# Patient Record
Sex: Male | Born: 1960
Health system: Southern US, Community
[De-identification: ages and names within clinical notes are randomized; demographics above are authoritative.]

## PROBLEM LIST (undated history)

## (undated) DIAGNOSIS — N138 Other obstructive and reflux uropathy: Secondary | ICD-10-CM

## (undated) DIAGNOSIS — E782 Mixed hyperlipidemia: Secondary | ICD-10-CM

## (undated) DIAGNOSIS — M19019 Primary osteoarthritis, unspecified shoulder: Secondary | ICD-10-CM

## (undated) DIAGNOSIS — Z972 Presence of dental prosthetic device (complete) (partial): Secondary | ICD-10-CM

## (undated) DIAGNOSIS — N529 Male erectile dysfunction, unspecified: Secondary | ICD-10-CM

## (undated) DIAGNOSIS — M722 Plantar fascial fibromatosis: Secondary | ICD-10-CM

## (undated) DIAGNOSIS — IMO0002 Reserved for concepts with insufficient information to code with codable children: Secondary | ICD-10-CM

## (undated) DIAGNOSIS — N401 Enlarged prostate with lower urinary tract symptoms: Secondary | ICD-10-CM

## (undated) HISTORY — DX: Other obstructive and reflux uropathy: N13.8

## (undated) HISTORY — DX: Plantar fascial fibromatosis: M72.2

## (undated) HISTORY — DX: Mixed hyperlipidemia: E78.2

## (undated) HISTORY — DX: Male erectile dysfunction, unspecified: N52.9

## (undated) HISTORY — DX: Primary osteoarthritis, unspecified shoulder: M19.019

## (undated) HISTORY — DX: Reserved for concepts with insufficient information to code with codable children: IMO0002

## (undated) HISTORY — DX: Benign prostatic hyperplasia with lower urinary tract symptoms: N40.1

---

## 2004-07-18 ENCOUNTER — Emergency Department: Payer: Self-pay | Admitting: Emergency Medicine

## 2006-11-09 ENCOUNTER — Ambulatory Visit: Payer: Self-pay | Admitting: Emergency Medicine

## 2006-12-31 ENCOUNTER — Ambulatory Visit: Payer: Self-pay | Admitting: Internal Medicine

## 2007-02-01 ENCOUNTER — Ambulatory Visit: Payer: Self-pay | Admitting: Emergency Medicine

## 2007-09-11 LAB — HM COLONOSCOPY

## 2008-04-23 HISTORY — PX: COLONOSCOPY: SHX174

## 2012-05-09 ENCOUNTER — Ambulatory Visit: Payer: Self-pay | Admitting: Family Medicine

## 2012-07-18 ENCOUNTER — Ambulatory Visit: Payer: Self-pay | Admitting: Internal Medicine

## 2013-09-25 LAB — LIPID PANEL
CHOLESTEROL: 246 mg/dL — AB (ref 0–200)
HDL: 61 mg/dL (ref 35–70)
LDL CALC: 146 mg/dL
Triglycerides: 195 mg/dL — AB (ref 40–160)

## 2013-09-25 LAB — TSH: TSH: 0.8 u[IU]/mL (ref <=5.90)

## 2013-09-25 LAB — CBC AND DIFFERENTIAL: Hemoglobin: 14.5 g/dL (ref 13.5–17.5)

## 2013-09-25 LAB — PSA: PSA: 1.1

## 2015-02-17 ENCOUNTER — Other Ambulatory Visit: Payer: Self-pay | Admitting: Internal Medicine

## 2015-02-17 ENCOUNTER — Encounter: Payer: Self-pay | Admitting: Internal Medicine

## 2015-02-17 DIAGNOSIS — M722 Plantar fascial fibromatosis: Secondary | ICD-10-CM | POA: Insufficient documentation

## 2015-02-17 DIAGNOSIS — IMO0002 Reserved for concepts with insufficient information to code with codable children: Secondary | ICD-10-CM

## 2015-02-17 DIAGNOSIS — M19019 Primary osteoarthritis, unspecified shoulder: Secondary | ICD-10-CM

## 2015-02-17 DIAGNOSIS — F17209 Nicotine dependence, unspecified, with unspecified nicotine-induced disorders: Secondary | ICD-10-CM | POA: Insufficient documentation

## 2015-02-17 DIAGNOSIS — Z86010 Personal history of colonic polyps: Secondary | ICD-10-CM | POA: Insufficient documentation

## 2015-02-17 DIAGNOSIS — F39 Unspecified mood [affective] disorder: Secondary | ICD-10-CM | POA: Insufficient documentation

## 2015-02-17 DIAGNOSIS — E782 Mixed hyperlipidemia: Secondary | ICD-10-CM

## 2015-02-17 DIAGNOSIS — F17200 Nicotine dependence, unspecified, uncomplicated: Secondary | ICD-10-CM | POA: Insufficient documentation

## 2015-02-17 HISTORY — DX: Mixed hyperlipidemia: E78.2

## 2015-02-17 HISTORY — DX: Primary osteoarthritis, unspecified shoulder: M19.019

## 2015-02-17 HISTORY — DX: Plantar fascial fibromatosis: M72.2

## 2015-02-17 HISTORY — DX: Reserved for concepts with insufficient information to code with codable children: IMO0002

## 2015-08-05 DIAGNOSIS — M19042 Primary osteoarthritis, left hand: Secondary | ICD-10-CM | POA: Diagnosis not present

## 2015-08-05 DIAGNOSIS — M7711 Lateral epicondylitis, right elbow: Secondary | ICD-10-CM | POA: Diagnosis not present

## 2016-07-12 ENCOUNTER — Encounter: Payer: Self-pay | Admitting: Internal Medicine

## 2016-07-12 ENCOUNTER — Ambulatory Visit (INDEPENDENT_AMBULATORY_CARE_PROVIDER_SITE_OTHER): Payer: BLUE CROSS/BLUE SHIELD | Admitting: Internal Medicine

## 2016-07-12 VITALS — BP 114/68 | HR 62 | Ht 68.0 in | Wt 160.0 lb

## 2016-07-12 DIAGNOSIS — F39 Unspecified mood [affective] disorder: Secondary | ICD-10-CM

## 2016-07-12 MED ORDER — ESCITALOPRAM OXALATE 10 MG PO TABS: 10.0000 mg | ORAL_TABLET | Freq: Every day | ORAL | 0 refills | Status: DC

## 2016-07-12 NOTE — Progress Notes (Signed)
Date:  07/12/2016   Name:  Joel Humphrey   DOB:  1961/03/04   MRN:  [id number]   Chief Complaint: Anxiety (Just lost father, and feels overwhelmed with life right now. Pt does not feel suicidal but states " I couls snap at any minute. I'd hurt somebody before I could hurt myself." Stressed is the word he uses.) Anxiety  Presents for initial visit. Onset was 1 to 4 weeks ago. The problem has been gradually worsening. Symptoms include depressed mood, excessive worry, irritability and nervous/anxious behavior. Patient reports no chest pain, shortness of breath or suicidal ideas. Symptoms occur constantly. The symptoms are aggravated by work stress and family issues. The quality of sleep is poor.   Risk factors include alcohol intake.  His father passed away in [place].  He has to arrange the memorial.  His elderly mother needs to be moved here to live with him.  He is caring for his 4 y/o grandson.  He works long hours and is financially responsible for a disabled sister as well.   At this time he can not concentrate at work.  His sleep is poor.  He awakens with an active mind and worry.  He does not think he can work effectively and likely needs some time for medication to go into effect.    Review of Systems  Constitutional: Positive for irritability. Negative for chills, fatigue, fever and unexpected weight change.  Respiratory: Negative for chest tightness and shortness of breath.   Cardiovascular: Negative for chest pain.  Psychiatric/Behavioral: Positive for dysphoric mood and sleep disturbance. Negative for suicidal ideas. The patient is nervous/anxious.     Patient Active Problem List   Diagnosis Date Noted  . AC (acromioclavicular) arthritis 02/17/2015  . History of colon polyps 02/17/2015  . Combined fat and carbohydrate induced hyperlipemia 02/17/2015  . Episodic mood disorder ([place]) 02/17/2015  . Plantar fasciitis 02/17/2015  . Tendinitis of elbow or forearm 02/17/2015  .  Compulsive tobacco user syndrome 02/17/2015    Prior to Admission medications   Not on File    No Known Allergies  Past Surgical History:  Procedure Laterality Date  . COLONOSCOPY  2010   @ [facility]    Social History  Substance Use Topics  . Smoking status: Current Every Day Smoker  . Smokeless tobacco: Never Used  . Alcohol use 12.6 oz/week    21 Standard drinks or equivalent per week     Medication list has been reviewed and updated.   Physical Exam  Constitutional: He is oriented to person, place, and time. He appears well-developed. No distress.  HENT:  Head: Normocephalic and atraumatic.  Cardiovascular: Normal rate, regular rhythm and normal heart sounds.   Pulmonary/Chest: Effort normal. No respiratory distress.  Musculoskeletal: Normal range of motion.  Neurological: He is alert and oriented to person, place, and time.  Skin: Skin is warm and dry. No rash noted.  Psychiatric: His speech is normal and behavior is normal. Judgment and thought content normal. Cognition and memory are normal. He exhibits a depressed mood.  Nursing note and vitals reviewed.   BP 114/68 (BP Location: Right Arm, Patient Position: Sitting, Cuff Size: Normal)   Pulse 62   Ht 5\' 8"  (1.727 m)   Wt 160 lb (72.6 kg)   SpO2 97%   BMI 24.33 kg/m   Assessment and Plan: 1. Mood disorder (HCC) Limit alcohol intake Consider counseling Follow up 4-6 weeks Will complete FMLA - escitalopram (LEXAPRO) 10 MG tablet;  Take 1 tablet (10 mg total) by mouth daily.  Dispense: 90 tablet; Refill: 0   Meds ordered this encounter  Medications  . escitalopram (LEXAPRO) 10 MG tablet    Sig: Take 1 tablet (10 mg total) by mouth daily.    Dispense:  90 tablet    Refill:  0    Halina Maidens, MD [facility]  07/12/2016

## 2016-07-30 ENCOUNTER — Other Ambulatory Visit: Payer: Self-pay | Admitting: Internal Medicine

## 2016-07-30 MED ORDER — CITALOPRAM HYDROBROMIDE 20 MG PO TABS: 20.0000 mg | ORAL_TABLET | Freq: Every day | ORAL | 0 refills | Status: DC

## 2016-08-23 ENCOUNTER — Encounter: Payer: Self-pay | Admitting: Internal Medicine

## 2016-08-23 ENCOUNTER — Ambulatory Visit (INDEPENDENT_AMBULATORY_CARE_PROVIDER_SITE_OTHER): Payer: BLUE CROSS/BLUE SHIELD | Admitting: Internal Medicine

## 2016-08-23 VITALS — BP 110/80 | HR 72 | Ht 68.0 in | Wt 158.2 lb

## 2016-08-23 DIAGNOSIS — F39 Unspecified mood [affective] disorder: Secondary | ICD-10-CM

## 2016-08-23 MED ORDER — BUPROPION HCL ER (XL) 300 MG PO TB24: 300.0000 mg | ORAL_TABLET | Freq: Every day | ORAL | 0 refills | Status: DC

## 2016-08-23 NOTE — Patient Instructions (Addendum)
Call [facility] in [place] for an appointment as soon as possible [phone]

## 2016-08-23 NOTE — Progress Notes (Signed)
    Date:  08/23/2016   Name:  Joel Humphrey   DOB:  [birth year]   MRN:  [id number]   Chief Complaint: mood disorder (Don't feel different. Still having anxiety, not feeling any better. Scared to ask for anything stronger, but he knows he needs "help right now". ) he is taking Celexa 20 mg without side effects.  He is not sure if it is helping.  He is still stressed by his job and taking care of multiple family.  The celexa may have helped him feel slightly calmer but he is not where he needs to be. His FMLA runs out soon and needs to be extended.  I suggested another 6 weeks and well as seeing Psych.   Review of Systems  Psychiatric/Behavioral: Positive for agitation, dysphoric mood and sleep disturbance. Negative for hallucinations, self-injury and suicidal ideas. The patient is nervous/anxious.     Patient Active Problem List   Diagnosis Date Noted  . AC (acromioclavicular) arthritis 02/17/2015  . History of colon polyps 02/17/2015  . Combined fat and carbohydrate induced hyperlipemia 02/17/2015  . Episodic mood disorder ([place]) 02/17/2015  . Plantar fasciitis 02/17/2015  . Tendinitis of elbow or forearm 02/17/2015  . Compulsive tobacco user syndrome 02/17/2015    Prior to Admission medications   Medication Sig Start Date End Date Taking? Authorizing Provider  citalopram (CELEXA) 20 MG tablet Take 1 tablet (20 mg total) by mouth daily. 07/30/16  Yes [name], MD    No Known Allergies  Past Surgical History:  Procedure Laterality Date  . COLONOSCOPY  2010   @ [facility]    Social History  Substance Use Topics  . Smoking status: Current Every Day Smoker  . Smokeless tobacco: Never Used  . Alcohol use 12.6 oz/week    21 Standard drinks or equivalent per week     Medication list has been reviewed and updated.   Physical Exam  Constitutional: He is oriented to person, place, and time. He appears well-developed. No distress.  HENT:  Head: Normocephalic and atraumatic.    Pulmonary/Chest: Effort normal. No respiratory distress.  Musculoskeletal: Normal range of motion.  Neurological: He is alert and oriented to person, place, and time.  Skin: Skin is warm and dry. No rash noted.  Psychiatric: His speech is normal and behavior is normal. Thought content normal. His mood appears anxious. His affect is not angry and not inappropriate.  Nursing note and vitals reviewed.   BP 110/80 (BP Location: Right Arm, Patient Position: Sitting, Cuff Size: Normal)   Pulse 72   Ht 5\' 8"  (1.727 m)   Wt 158 lb 3.2 oz (71.8 kg)   SpO2 99%   BMI 24.05 kg/m   Assessment and Plan: 1. Episodic mood disorder (HCC) Continue Celexa Add Welbutrin Referred to [place] ordered this encounter  Medications  . buPROPion (WELLBUTRIN XL) 300 MG 24 hr tablet    Sig: Take 1 tablet (300 mg total) by mouth daily.    Dispense:  90 tablet    Refill:  0    Halina Maidens, MD [facility]  08/23/2016

## 2016-08-27 ENCOUNTER — Telehealth: Payer: Self-pay

## 2016-08-27 NOTE — Telephone Encounter (Signed)
Pt came into office requesting different medication or generic prescription for bupropion medication. States it is over $200 at pharmacy.

## 2016-08-27 NOTE — Telephone Encounter (Signed)
Bupropion is generic.  He should go ahead a see [place].  I do not have another generic alternative to offer him.

## 2016-08-28 NOTE — Telephone Encounter (Signed)
Spoke to pt. HE forgot to call [facility], but is currently in [place] and will stop by to make an appt. - informed no other generic meds for Bupropion.

## 2016-10-01 DIAGNOSIS — F331 Major depressive disorder, recurrent, moderate: Secondary | ICD-10-CM | POA: Diagnosis not present

## 2016-10-01 DIAGNOSIS — Z79899 Other long term (current) drug therapy: Secondary | ICD-10-CM | POA: Diagnosis not present

## 2016-10-04 ENCOUNTER — Encounter: Payer: Self-pay | Admitting: Internal Medicine

## 2016-10-04 ENCOUNTER — Ambulatory Visit (INDEPENDENT_AMBULATORY_CARE_PROVIDER_SITE_OTHER): Payer: BLUE CROSS/BLUE SHIELD | Admitting: Internal Medicine

## 2016-10-04 VITALS — BP 128/78 | HR 89 | Ht 68.0 in | Wt 155.0 lb

## 2016-10-04 DIAGNOSIS — F39 Unspecified mood [affective] disorder: Secondary | ICD-10-CM | POA: Diagnosis not present

## 2016-10-04 DIAGNOSIS — M722 Plantar fascial fibromatosis: Secondary | ICD-10-CM | POA: Diagnosis not present

## 2016-10-04 NOTE — Progress Notes (Signed)
Date:  10/04/2016   Name:  Joel Humphrey   DOB:  06-14-1960   MRN:  [id number]   Chief Complaint: mood disorder (Seen [facility]- was started on medication. Going to pick up medication and will call and let us know the name. Pt is gonna wheen off Citalopram to start new medication. ) Depression         This is a chronic problem.  The problem occurs constantly.The problem is unchanged.  Associated symptoms include myalgias.  Associated symptoms include no fatigue and no suicidal ideas.  Past treatments include SSRIs - Selective serotonin reuptake inhibitors. Foot Injury   The pain is present in the left foot and right foot. The quality of the pain is described as burning and cramping. The pain is moderate. He has tried NSAIDs and ice (got orthotics from [facility] for plantar fasciitis and needs to follow up with him) for the symptoms.   He has seen [facility] and they are weaning him off of celexa and starting a new medication - he will call with the name.    Review of Systems  Constitutional: Negative for chills, fatigue and fever.  Respiratory: Negative for chest tightness and shortness of breath.   Cardiovascular: Negative for chest pain.  Musculoskeletal: Positive for arthralgias and myalgias.  Skin: Negative for pallor.  Psychiatric/Behavioral: Positive for depression, dysphoric mood and sleep disturbance. Negative for suicidal ideas.    Patient Active Problem List   Diagnosis Date Noted  . AC (acromioclavicular) arthritis 02/17/2015  . History of colon polyps 02/17/2015  . Combined fat and carbohydrate induced hyperlipemia 02/17/2015  . Episodic mood disorder ([place]) 02/17/2015  . Plantar fasciitis 02/17/2015  . Tendinitis of elbow or forearm 02/17/2015  . Compulsive tobacco user syndrome 02/17/2015    Prior to Admission medications   Medication Sig Start Date End Date Taking? Authorizing Provider  citalopram (CELEXA) 20 MG tablet Take 1 tablet (20 mg total) by mouth daily. 07/30/16   Yes [name], MD    No Known Allergies  Past Surgical History:  Procedure Laterality Date  . COLONOSCOPY  2010   @ [facility]    Social History  Substance Use Topics  . Smoking status: Current Every Day Smoker  . Smokeless tobacco: Never Used  . Alcohol use 12.6 oz/week    21 Standard drinks or equivalent per week   Depression screen [facility] 2/9 10/04/2016 08/23/2016  Decreased Interest 0 1  Down, Depressed, Hopeless 1 3  PHQ - 2 Score 1 4  Altered sleeping 0 0  Tired, decreased energy 2 3  Change in appetite 0 1  Feeling bad or failure about yourself  0 0  Trouble concentrating 0 1  Moving slowly or fidgety/restless 0 0  Suicidal thoughts 0 0  PHQ-9 Score 3 9  Difficult doing work/chores Not difficult at all Very difficult     Medication list has been reviewed and updated.   Physical Exam  Constitutional: He is oriented to person, place, and time. He appears well-developed. No distress.  HENT:  Head: Normocephalic and atraumatic.  Cardiovascular: Normal rate and regular rhythm.   Pulmonary/Chest: Effort normal and breath sounds normal. No respiratory distress.  Musculoskeletal: Normal range of motion.  Mild pain in foot to dorsiflexion No edema  Neurological: He is alert and oriented to person, place, and time.  Skin: Skin is warm and dry. No rash noted.  Psychiatric: He has a normal mood and affect. His speech is normal and behavior  is normal. Thought content normal.  Nursing note and vitals reviewed.   BP 128/78   Pulse 89   Ht 5\' 8"  (1.727 m)   Wt 155 lb (70.3 kg)   SpO2 99%   BMI 23.57 kg/m   Assessment and Plan: 1. Plantar fasciitis Follow up with [facility]  2. Episodic mood disorder ([place]) Taper celexa and begin new medication as directed by [facility]   No orders of the defined types were placed in this encounter.   Halina Maidens, MD [facility]  10/04/2016

## 2016-10-22 DIAGNOSIS — Z79899 Other long term (current) drug therapy: Secondary | ICD-10-CM | POA: Diagnosis not present

## 2016-10-22 DIAGNOSIS — F331 Major depressive disorder, recurrent, moderate: Secondary | ICD-10-CM | POA: Diagnosis not present

## 2016-11-13 ENCOUNTER — Telehealth: Payer: Self-pay

## 2016-11-13 NOTE — Telephone Encounter (Signed)
Pt called stating he seen [facility]- Dr. Starleen Blue. And spoke to this doctor about his FMLA. Dr. Starleen Blue said he is not comfortable filling out FMLA forms since he has not seen pt long enough. Pt asked if we can give a additional extension from July 1st to the end of August until he see's [facility] again. Spoke with Dr. Army Melia and she said it was fine. Pt was told on VM he needs to bring these papers in tomorrow to be filled out otherwise Dr. Army Melia will be on vacation and won't be back until Aug 6. Told pt to bring papers tomorrow or call office to let us know what he wants to do. Awaiting call back.

## 2016-11-22 DIAGNOSIS — M79672 Pain in left foot: Secondary | ICD-10-CM | POA: Diagnosis not present

## 2016-11-22 DIAGNOSIS — M79671 Pain in right foot: Secondary | ICD-10-CM | POA: Diagnosis not present

## 2016-11-22 DIAGNOSIS — D219 Benign neoplasm of connective and other soft tissue, unspecified: Secondary | ICD-10-CM | POA: Diagnosis not present

## 2016-11-22 DIAGNOSIS — M722 Plantar fascial fibromatosis: Secondary | ICD-10-CM | POA: Diagnosis not present

## 2016-11-22 DIAGNOSIS — D2121 Benign neoplasm of connective and other soft tissue of right lower limb, including hip: Secondary | ICD-10-CM | POA: Diagnosis not present

## 2016-12-07 ENCOUNTER — Encounter: Payer: Self-pay | Admitting: Internal Medicine

## 2016-12-07 ENCOUNTER — Ambulatory Visit (INDEPENDENT_AMBULATORY_CARE_PROVIDER_SITE_OTHER): Payer: BLUE CROSS/BLUE SHIELD | Admitting: Internal Medicine

## 2016-12-07 VITALS — BP 112/74 | HR 71 | Temp 98.4°F | Ht 68.0 in | Wt 157.0 lb

## 2016-12-07 DIAGNOSIS — H902 Conductive hearing loss, unspecified: Secondary | ICD-10-CM | POA: Diagnosis not present

## 2016-12-07 DIAGNOSIS — H6123 Impacted cerumen, bilateral: Secondary | ICD-10-CM | POA: Diagnosis not present

## 2016-12-07 NOTE — Progress Notes (Signed)
    Date:  12/07/2016   Name:  Joel Humphrey   DOB:  [birth year]   MRN:  [id number]   Chief Complaint: Ear Fullness (LEFT ear feels clogged up. Have not been able to hear out of for 2 weeks now. Does not use Q Tips.  ) Ear Fullness   There is pain in the left ear. This is a new problem. The current episode started 1 to 4 weeks ago. The problem occurs constantly. The problem has been unchanged. There has been no fever. Associated symptoms include hearing loss. Pertinent negatives include no ear discharge.      Review of Systems  Constitutional: Negative for chills, fatigue and fever.  HENT: Positive for hearing loss. Negative for ear discharge and ear pain.   Respiratory: Negative for chest tightness and shortness of breath.   Cardiovascular: Negative for chest pain.    Patient Active Problem List   Diagnosis Date Noted  . AC (acromioclavicular) arthritis 02/17/2015  . History of colon polyps 02/17/2015  . Combined fat and carbohydrate induced hyperlipemia 02/17/2015  . Episodic mood disorder ([place]) 02/17/2015  . Plantar fasciitis 02/17/2015  . Tendinitis of elbow or forearm 02/17/2015  . Compulsive tobacco user syndrome 02/17/2015    Prior to Admission medications   Medication Sig Start Date End Date Taking? Authorizing Provider  citalopram (CELEXA) 20 MG tablet Take 1 tablet (20 mg total) by mouth daily. 07/30/16  Yes [name], MD    No Known Allergies  Past Surgical History:  Procedure Laterality Date  . COLONOSCOPY  2010   @ [facility]    Social History  Substance Use Topics  . Smoking status: Current Every Day Smoker  . Smokeless tobacco: Never Used  . Alcohol use 12.6 oz/week    21 Standard drinks or equivalent per week     Medication list has been reviewed and updated.   Physical Exam  Constitutional: He is oriented to person, place, and time. He appears well-developed. No distress.  HENT:  Head: Normocephalic and atraumatic.  Right Ear: No drainage.    Left Ear: No drainage. Decreased hearing is noted.  Nose: Right sinus exhibits no maxillary sinus tenderness. Left sinus exhibits no maxillary sinus tenderness.  Impacted cerumen bilaterally  Cardiovascular: Normal rate, regular rhythm and normal heart sounds.   Pulmonary/Chest: Effort normal and breath sounds normal. No respiratory distress. He has no wheezes.  Musculoskeletal: Normal range of motion.  Lymphadenopathy:       Head (right side): No submandibular, no preauricular and no posterior auricular adenopathy present.       Head (left side): No submandibular, no preauricular and no posterior auricular adenopathy present.  Neurological: He is alert and oriented to person, place, and time.  Skin: Skin is warm and dry. No rash noted.  Psychiatric: He has a normal mood and affect. His behavior is normal. Thought content normal.  Nursing note and vitals reviewed.   BP 112/74   Pulse 71   Temp 98.4 F (36.9 C)   Ht 5\' 8"  (1.727 m)   Wt 157 lb (71.2 kg)   SpO2 99%   BMI 23.87 kg/m   Assessment and Plan: 1. Impacted cerumen of both ears Recommend ENT referral   No orders of the defined types were placed in this encounter.   Halina Maidens, MD [facility]  12/07/2016

## 2016-12-21 DIAGNOSIS — Z6823 Body mass index (BMI) 23.0-23.9, adult: Secondary | ICD-10-CM | POA: Diagnosis not present

## 2016-12-21 DIAGNOSIS — D219 Benign neoplasm of connective and other soft tissue, unspecified: Secondary | ICD-10-CM | POA: Diagnosis not present

## 2016-12-21 DIAGNOSIS — M722 Plantar fascial fibromatosis: Secondary | ICD-10-CM | POA: Diagnosis not present

## 2016-12-31 DIAGNOSIS — M722 Plantar fascial fibromatosis: Secondary | ICD-10-CM | POA: Diagnosis not present

## 2017-04-30 DIAGNOSIS — H40003 Preglaucoma, unspecified, bilateral: Secondary | ICD-10-CM | POA: Diagnosis not present

## 2017-12-10 ENCOUNTER — Ambulatory Visit (INDEPENDENT_AMBULATORY_CARE_PROVIDER_SITE_OTHER): Payer: BLUE CROSS/BLUE SHIELD | Admitting: Internal Medicine

## 2017-12-10 ENCOUNTER — Encounter: Payer: Self-pay | Admitting: Internal Medicine

## 2017-12-10 ENCOUNTER — Other Ambulatory Visit (HOSPITAL_COMMUNITY)
Admission: RE | Admit: 2017-12-10 | Discharge: 2017-12-10 | Disposition: A | Discharge status: Home / Self Care | Payer: BLUE CROSS/BLUE SHIELD | Source: Ambulatory Visit | Attending: Internal Medicine | Admitting: Internal Medicine

## 2017-12-10 VITALS — BP 120/78 | HR 78 | Ht 68.0 in | Wt 162.0 lb

## 2017-12-10 DIAGNOSIS — N529 Male erectile dysfunction, unspecified: Secondary | ICD-10-CM | POA: Insufficient documentation

## 2017-12-10 DIAGNOSIS — Z0000 Encounter for general adult medical examination without abnormal findings: Secondary | ICD-10-CM

## 2017-12-10 DIAGNOSIS — Z86010 Personal history of colonic polyps: Secondary | ICD-10-CM | POA: Insufficient documentation

## 2017-12-10 DIAGNOSIS — Z1159 Encounter for screening for other viral diseases: Secondary | ICD-10-CM | POA: Diagnosis not present

## 2017-12-10 DIAGNOSIS — F17200 Nicotine dependence, unspecified, uncomplicated: Secondary | ICD-10-CM

## 2017-12-10 DIAGNOSIS — E782 Mixed hyperlipidemia: Secondary | ICD-10-CM

## 2017-12-10 DIAGNOSIS — N401 Enlarged prostate with lower urinary tract symptoms: Secondary | ICD-10-CM | POA: Insufficient documentation

## 2017-12-10 DIAGNOSIS — N138 Other obstructive and reflux uropathy: Secondary | ICD-10-CM

## 2017-12-10 HISTORY — DX: Male erectile dysfunction, unspecified: N52.9

## 2017-12-10 HISTORY — DX: Benign prostatic hyperplasia with lower urinary tract symptoms: N40.1

## 2017-12-10 HISTORY — DX: Other obstructive and reflux uropathy: N13.8

## 2017-12-10 LAB — POCT URINALYSIS DIPSTICK
Bilirubin, UA: NEGATIVE
Glucose, UA: NEGATIVE
Ketones, UA: NEGATIVE
LEUKOCYTES UA: NEGATIVE
Nitrite, UA: NEGATIVE
PH UA: 6 (ref 5.0–8.0)
Protein, UA: NEGATIVE
RBC UA: NEGATIVE
Spec Grav, UA: 1.01 (ref 1.010–1.025)
UROBILINOGEN UA: 0.2 U/dL

## 2017-12-10 MED ORDER — SILDENAFIL CITRATE 20 MG PO TABS: 20.0000 mg | ORAL_TABLET | Freq: Every day | ORAL | 0 refills | Status: DC | PRN

## 2017-12-10 NOTE — Progress Notes (Signed)
Date:  12/10/2017   Name:  Joel Humphrey   DOB:  1961-01-16   MRN:  [id number]   Chief Complaint: Annual Exam SERGEY ISHLER is a 57 y.o. male who presents today for his Complete Annual Exam. He feels well. He reports exercising none but stays very busy. He reports he is sleeping well.  He complains of urinary urgency but no hematuria or nocturia.  He has intermittent slow stream.  No family hx of prostate cancer. He is due for 10 yr colonoscopy. He continues to smoke ~ 1/3 ppd. He has mild ED - sometimes his erection does not last.  He have never used viagra.  Review of Systems  Constitutional: Negative for appetite change, chills, diaphoresis, fatigue and unexpected weight change.  HENT: Negative for dental problem, hearing loss, tinnitus, trouble swallowing and voice change.   Eyes: Negative for visual disturbance.  Respiratory: Negative for apnea, choking, shortness of breath and wheezing.   Cardiovascular: Negative for chest pain, palpitations and leg swelling.  Gastrointestinal: Negative for abdominal pain, blood in stool, constipation and diarrhea.  Genitourinary: Positive for urgency. Negative for difficulty urinating, dysuria and frequency.       Mild ED  Musculoskeletal: Negative for arthralgias, back pain and myalgias.  Skin: Negative for color change and rash.  Allergic/Immunologic: Negative for environmental allergies.  Neurological: Negative for dizziness, syncope and headaches.  Hematological: Negative for adenopathy.  Psychiatric/Behavioral: Negative for dysphoric mood and sleep disturbance. The patient is not nervous/anxious.     Patient Active Problem List   Diagnosis Date Noted  . AC (acromioclavicular) arthritis 02/17/2015  . History of colon polyps 02/17/2015  . Combined fat and carbohydrate induced hyperlipemia 02/17/2015  . Episodic mood disorder ([place]) 02/17/2015  . Plantar fasciitis 02/17/2015  . Tendinitis of elbow or forearm 02/17/2015  . Compulsive  tobacco user syndrome 02/17/2015    No Known Allergies  Past Surgical History:  Procedure Laterality Date  . COLONOSCOPY  2010   @ [facility]    Social History   Tobacco Use  . Smoking status: Current Every Day Smoker  . Smokeless tobacco: Never Used  Substance Use Topics  . Alcohol use: Yes    Alcohol/week: 21.0 standard drinks    Types: 21 Standard drinks or equivalent per week  . Drug use: Not on file     Medication list has been reviewed and updated.  No outpatient medications have been marked as taking for the 12/10/17 encounter (Office Visit) with [name], MD.    [facility] 2/9 Scores 12/10/2017 10/04/2016 08/23/2016  PHQ - 2 Score 0 1 4  PHQ- 9 Score - 3 9   IPSS Questionnaire (AUA-7): Over the past month.   1)  How often have you had a sensation of not emptying your bladder completely after you finish urinating?  0 - Not at all  2)  How often have you had to urinate again less than two hours after you finished urinating? 1 - Less than 1 time in 5  3)  How often have you found you stopped and started again several times when you urinated?  0 - Not at all  4) How difficult have you found it to postpone urination?  4 - More than half the time  5) How often have you had a weak urinary stream?  1 - Less than 1 time in 5  6) How often have you had to push or strain to begin urination?  1 - Less  than 1 time in 5  7) How many times did you most typically get up to urinate from the time you went to bed until the time you got up in the morning?  0 - None  Total score:  0-7 mildly symptomatic   8-19 moderately symptomatic   20-35 severely symptomatic    Physical Exam  Constitutional: He is oriented to person, place, and time. He appears well-developed and well-nourished.  HENT:  Head: Normocephalic.  Right Ear: Tympanic membrane, external ear and ear canal normal.  Left Ear: Tympanic membrane, external ear and ear canal normal.  Nose: Nose normal.  Mouth/Throat: Uvula is  midline and oropharynx is clear and moist.  Eyes: Pupils are equal, round, and reactive to light. Conjunctivae and EOM are normal.  Neck: Normal range of motion. Neck supple. Carotid bruit is not present. No thyromegaly present.  Cardiovascular: Normal rate, regular rhythm, normal heart sounds and intact distal pulses.  Pulmonary/Chest: Effort normal and breath sounds normal. He has no wheezes. Right breast exhibits no mass. Left breast exhibits no mass.  Abdominal: Soft. Normal appearance and bowel sounds are normal. There is no hepatosplenomegaly. There is no tenderness.  Genitourinary: Rectum normal. Prostate is not enlarged.  Genitourinary Comments: Prostate nodular, firm but not enlarged  Musculoskeletal: Normal range of motion.  Lymphadenopathy:    He has no cervical adenopathy.  Neurological: He is alert and oriented to person, place, and time. He has normal reflexes.  Skin: Skin is warm, dry and intact.  Psychiatric: He has a normal mood and affect. His speech is normal and behavior is normal. Judgment and thought content normal.  Nursing note and vitals reviewed.   BP 120/78 (BP Location: Right Arm, Patient Position: Sitting, Cuff Size: Normal)   Pulse 78   Ht 5\' 8"  (1.727 m)   Wt 162 lb (73.5 kg)   SpO2 98%   BMI 24.63 kg/m   Assessment and Plan: 1. Annual physical exam Continue healthy diet Recommend regular exercise - CBC with Differential/Platelet - Comprehensive metabolic panel - HIV antibody - GC/Chlamydia probe amp ([place])not at [facility]  2. Combined fat and carbohydrate induced hyperlipemia Will advise if medication is needed - Lipid panel - TSH  3. Compulsive tobacco user syndrome Continue to work on cutting back, use patches as needed  4. History of colon polyps Due for 10 yr repeat colonoscopy  - Ambulatory referral to Gastroenterology  5. BPH with obstruction/lower urinary tract symptoms Nodular prostate on exam with AUA score 7 - POCT urinalysis  dipstick - PSA - Ambulatory referral to Urology  6. Erectile dysfunction, unspecified erectile dysfunction type Will try lose dose sildenafil - sildenafil (REVATIO) 20 MG tablet; Take 1 tablet (20 mg total) by mouth daily as needed.  Dispense: 10 tablet; Refill: 0  7. Need for hepatitis C screening test - Hepatitis C antibody   Meds ordered this encounter  Medications  . sildenafil (REVATIO) 20 MG tablet    Sig: Take 1 tablet (20 mg total) by mouth daily as needed.    Dispense:  10 tablet    Refill:  0    Partially dictated using Editor, commissioning. Any errors are unintentional.  Halina Maidens, MD [facility]  12/10/2017

## 2017-12-10 NOTE — Patient Instructions (Signed)

## 2017-12-11 LAB — GC/CHLAMYDIA PROBE AMP (~~LOC~~) NOT AT ARMC
Chlamydia: NEGATIVE
NEISSERIA GONORRHEA: NEGATIVE

## 2017-12-17 ENCOUNTER — Telehealth: Payer: Self-pay

## 2017-12-17 NOTE — Telephone Encounter (Signed)
LVM reminding patient to have bloodwork done as soon as possible for Dr Army Melia.

## 2017-12-17 NOTE — Telephone Encounter (Signed)
-----   Message from [name], MD sent at 12/16/2017  4:37 PM EDT ----- Please call patient to remind him to get his blood work done.

## 2017-12-20 ENCOUNTER — Other Ambulatory Visit: Payer: Self-pay

## 2017-12-20 DIAGNOSIS — Z86010 Personal history of colonic polyps: Secondary | ICD-10-CM

## 2018-01-10 ENCOUNTER — Other Ambulatory Visit
Admission: RE | Admit: 2018-01-10 | Discharge: 2018-01-10 | Disposition: A | Discharge status: Home / Self Care | Payer: BLUE CROSS/BLUE SHIELD | Source: Ambulatory Visit | Attending: Urology | Admitting: Urology

## 2018-01-10 ENCOUNTER — Encounter: Payer: Self-pay | Admitting: Urology

## 2018-01-10 ENCOUNTER — Ambulatory Visit (INDEPENDENT_AMBULATORY_CARE_PROVIDER_SITE_OTHER): Payer: BLUE CROSS/BLUE SHIELD | Admitting: Urology

## 2018-01-10 VITALS — BP 123/77 | HR 73 | Ht 68.0 in | Wt 148.0 lb

## 2018-01-10 DIAGNOSIS — N429 Disorder of prostate, unspecified: Secondary | ICD-10-CM | POA: Diagnosis not present

## 2018-01-10 DIAGNOSIS — N401 Enlarged prostate with lower urinary tract symptoms: Secondary | ICD-10-CM | POA: Insufficient documentation

## 2018-01-10 DIAGNOSIS — N138 Other obstructive and reflux uropathy: Secondary | ICD-10-CM | POA: Diagnosis not present

## 2018-01-10 LAB — PSA: PROSTATIC SPECIFIC ANTIGEN: 1.53 ng/mL (ref 0.00–4.00)

## 2018-01-10 LAB — BLADDER SCAN AMB NON-IMAGING

## 2018-01-10 NOTE — Progress Notes (Signed)
01/10/2018 10:27 AM   Joel Humphrey [birth year] [id number]  Referring provider: [name], MD [address] [place], [place] [address]  Chief Complaint  Patient presents with  . Benign Prostatic Hypertrophy    New Patient    HPI: Patient is a 57 year old African-American male who is referred by Joel Humphrey for BPH with LU TS and a nodular prostate.  About a month ago, he had a complaints of urinary urgency and a slow intermittent stream.  He was also having mild erectile dysfunction.  Patient states that the urinary urgency and intermittent slow urinary stream have resolved.  Patient denies any gross hematuria, dysuria or suprapubic/flank pain.  Patient denies any fevers, chills, nausea or vomiting.   He does not have a family history of prostate cancer.  He has not seen an urologist in the past.     PMH: Past Medical History:  Diagnosis Date  . AC (acromioclavicular) arthritis 02/17/2015  . BPH with obstruction/lower urinary tract symptoms 12/10/2017  . Combined fat and carbohydrate induced hyperlipemia 02/17/2015  . Erectile dysfunction 12/10/2017  . Plantar fasciitis 02/17/2015  . Tendinitis of elbow or forearm 02/17/2015    Surgical History: Past Surgical History:  Procedure Laterality Date  . COLONOSCOPY  2010   @ [facility] Medications:  Allergies as of 01/10/2018   No Known Allergies     Medication List        Accurate as of 01/10/18 10:27 AM. Always use your most recent med list.          sildenafil 20 MG tablet Commonly known as:  REVATIO Take 1 tablet (20 mg total) by mouth daily as needed.       Allergies: No Known Allergies  Family History: Family History  Problem Relation Age of Onset  . Hypertension Father   . Diabetes Father     Social History:  reports that he has been smoking cigarettes. He started smoking about 27 years ago. He has a 8.25 pack-year smoking history. He has never used smokeless tobacco. He  reports that he drinks about 21.0 standard drinks of alcohol per week. His drug history is not on file.  ROS: UROLOGY Frequent Urination?: No Hard to postpone urination?: No Burning/pain with urination?: No Get up at night to urinate?: No Leakage of urine?: No Urine stream starts and stops?: No Trouble starting stream?: No Do you have to strain to urinate?: No Blood in urine?: No Urinary tract infection?: No Sexually transmitted disease?: No Injury to kidneys or bladder?: No Painful intercourse?: No Weak stream?: No Erection problems?: No Penile pain?: No  Gastrointestinal Nausea?: No Vomiting?: No Indigestion/heartburn?: No Diarrhea?: No Constipation?: No  Constitutional Fever: No Night sweats?: No Weight loss?: No Fatigue?: No  Skin Skin rash/lesions?: No Itching?: No  Eyes Blurred vision?: No Double vision?: No  Ears/Nose/Throat Sore throat?: No Sinus problems?: No  Hematologic/Lymphatic Swollen glands?: No Easy bruising?: No  Cardiovascular Leg swelling?: No Chest pain?: No  Respiratory Cough?: No Shortness of breath?: No  Endocrine Excessive thirst?: No  Musculoskeletal Back pain?: No Joint pain?: No  Neurological Headaches?: No Dizziness?: No  Psychologic Depression?: No Anxiety?: No  Physical Exam: BP 123/77   Pulse 73   Ht 5\' 8"  (1.727 m)   Wt 148 lb (67.1 kg)   BMI 22.50 kg/m   Constitutional:  Well nourished. Alert and oriented, No acute distress. HEENT: Nectar AT, moist mucus membranes.  Trachea midline, no masses. Cardiovascular:  No clubbing, cyanosis, or edema. Respiratory: Normal respiratory effort, no increased work of breathing. GI: Abdomen is soft, non tender, non distended, no abdominal masses. Liver and spleen not palpable.  No hernias appreciated.  Stool sample for occult testing is not indicated.   GU: No CVA tenderness.  No bladder fullness or masses.  Patient with circumcised phallus.  Urethral meatus is patent.   No penile discharge. No penile lesions or rashes. Scrotum without lesions, cysts, rashes and/or edema.  Testicles are located scrotally bilaterally. No masses are appreciated in the testicles. Left and right epididymis are normal. Rectal: Patient with  normal sphincter tone. Anus and perineum without scarring or rashes. No rectal masses are appreciated. Prostate is approximately 45 grams, pea sized nodule in the left lobe and 5 mm x 10 mm in the right lobe nodules are appreciated. Seminal vesicles are normal. Skin: No rashes, bruises or suspicious lesions. Lymph: No cervical or inguinal adenopathy. Neurologic: Grossly intact, no focal deficits, moving all 4 extremities. Psychiatric: Normal mood and affect.  Laboratory Data: Lab Results  Component Value Date   HGB 14.5 09/25/2013    No results found for: CREATININE   PSA   1.0   2014 Lab Results  Component Value Date   PSA 1.1 09/25/2013  PSA 1.53 on 01/10/2018  No results found for: TESTOSTERONE  No results found for: HGBA1C  Lab Results  Component Value Date   TSH 0.80 09/25/2013       Component Value Date/Time   CHOL 246 (A) 09/25/2013   HDL 61 09/25/2013   LDLCALC 146 09/25/2013    No results found for: AST No results found for: ALT No components found for: ALKALINEPHOPHATASE No components found for: BILIRUBINTOTAL  No results found for: ESTRADIOL  Urinalysis    Component Value Date/Time   BILIRUBINUR neg 12/10/2017 1135   PROTEINUR Negative 12/10/2017 1135   UROBILINOGEN 0.2 12/10/2017 1135   NITRITE neg 12/10/2017 1135   LEUKOCYTESUR Negative 12/10/2017 1135    I have reviewed the labs.   Pertinent Imaging: Results for XZAVIOR, REINIG (MRN [id number]) as of 01/10/2018 10:31  Ref. Range 01/10/2018 10:13  Scan Result Unknown 73ml    Assessment & Plan:    1. Irregular prostate exam PSA is drawn today Patient will be schedule for a TRUSPBx of prostate.  The procedure is explained and the risks involved,  such as blood in urine, blood in stool, blood in semen, infection, urinary retention, and on rare occasions sepsis and death.  Patient understands the risks as explained to him and he wishes to proceed.  Patient is on ASA and is advised to discontinue the medication ten days prior to the biopsy.  2. BPH with LU TS PSA  LU TS has resolved at this time   Return for TRUSPBx of prostate .  These notes generated with voice recognition software. I apologize for typographical errors.  Zara Council, PA-C  [facility] [address]  [place], [place] [address] [phone]

## 2018-01-13 ENCOUNTER — Other Ambulatory Visit: Payer: Self-pay

## 2018-01-13 ENCOUNTER — Encounter: Payer: Self-pay | Admitting: *Deleted

## 2018-01-16 NOTE — Discharge Instructions (Signed)
General Anesthesia, Adult, Care After °These instructions provide you with information about caring for yourself after your procedure. Your health care provider may also give you more specific instructions. Your treatment has been planned according to current medical practices, but problems sometimes occur. Call your health care provider if you have any problems or questions after your procedure. °What can I expect after the procedure? °After the procedure, it is common to have: °· Vomiting. °· A sore throat. °· Mental slowness. ° °It is common to feel: °· Nauseous. °· Cold or shivery. °· Sleepy. °· Tired. °· Sore or achy, even in parts of your body where you did not have surgery. ° °Follow these instructions at home: °For at least 24 hours after the procedure: °· Do not: °? Participate in activities where you could fall or become injured. °? Drive. °? Use heavy machinery. °? Drink alcohol. °? Take sleeping pills or medicines that cause drowsiness. °? Make important decisions or sign legal documents. °? Take care of children on your own. °· Rest. °Eating and drinking °· If you vomit, drink water, juice, or soup when you can drink without vomiting. °· Drink enough fluid to keep your urine clear or pale yellow. °· Make sure you have little or no nausea before eating solid foods. °· Follow the diet recommended by your health care provider. °General instructions °· Have a responsible adult stay with you until you are awake and alert. °· Return to your normal activities as told by your health care provider. Ask your health care provider what activities are safe for you. °· Take over-the-counter and prescription medicines only as told by your health care provider. °· If you smoke, do not smoke without supervision. °· Keep all follow-up visits as told by your health care provider. This is important. °Contact a health care provider if: °· You continue to have nausea or vomiting at home, and medicines are not helpful. °· You  cannot drink fluids or start eating again. °· You cannot urinate after 8-12 hours. °· You develop a skin rash. °· You have fever. °· You have increasing redness at the site of your procedure. °Get help right away if: °· You have difficulty breathing. °· You have chest pain. °· You have unexpected bleeding. °· You feel that you are having a life-threatening or urgent problem. °This information is not intended to replace advice given to you by your health care provider. Make sure you discuss any questions you have with your health care provider. °Document Released: 07/16/2000 Document Revised: 09/12/2015 Document Reviewed: 03/24/2015 °Elsevier Interactive Patient Education © 2018 Else[employer] Inc. ° °

## 2018-01-17 ENCOUNTER — Ambulatory Visit: Payer: BLUE CROSS/BLUE SHIELD | Admitting: Anesthesiology

## 2018-01-17 ENCOUNTER — Ambulatory Visit
Admission: RE | Admit: 2018-01-17 | Discharge: 2018-01-17 | Disposition: A | Discharge status: Home / Self Care | Payer: BLUE CROSS/BLUE SHIELD | Source: Ambulatory Visit | Attending: Gastroenterology | Admitting: Gastroenterology

## 2018-01-17 ENCOUNTER — Encounter
Admission: RE | Disposition: A | Discharge status: Home / Self Care | Payer: Self-pay | Source: Ambulatory Visit | Attending: Gastroenterology

## 2018-01-17 DIAGNOSIS — Z1211 Encounter for screening for malignant neoplasm of colon: Secondary | ICD-10-CM | POA: Diagnosis not present

## 2018-01-17 DIAGNOSIS — K621 Rectal polyp: Secondary | ICD-10-CM | POA: Diagnosis not present

## 2018-01-17 DIAGNOSIS — K635 Polyp of colon: Secondary | ICD-10-CM | POA: Diagnosis not present

## 2018-01-17 DIAGNOSIS — Z86010 Personal history of colonic polyps: Secondary | ICD-10-CM

## 2018-01-17 DIAGNOSIS — D125 Benign neoplasm of sigmoid colon: Secondary | ICD-10-CM | POA: Insufficient documentation

## 2018-01-17 DIAGNOSIS — D128 Benign neoplasm of rectum: Secondary | ICD-10-CM | POA: Insufficient documentation

## 2018-01-17 DIAGNOSIS — N529 Male erectile dysfunction, unspecified: Secondary | ICD-10-CM | POA: Insufficient documentation

## 2018-01-17 DIAGNOSIS — E7801 Familial hypercholesterolemia: Secondary | ICD-10-CM | POA: Diagnosis not present

## 2018-01-17 DIAGNOSIS — F17210 Nicotine dependence, cigarettes, uncomplicated: Secondary | ICD-10-CM | POA: Insufficient documentation

## 2018-01-17 HISTORY — DX: Presence of dental prosthetic device (complete) (partial): Z97.2

## 2018-01-17 HISTORY — PX: COLONOSCOPY WITH PROPOFOL: SHX5780

## 2018-01-17 HISTORY — PX: POLYPECTOMY: SHX5525

## 2018-01-17 SURGERY — COLONOSCOPY WITH PROPOFOL
Anesthesia: General | Site: Rectum

## 2018-01-17 MED ORDER — LACTATED RINGERS IV SOLN
INTRAVENOUS | Status: DC
  Administered 2018-01-17: 08:00:00 via INTRAVENOUS

## 2018-01-17 MED ORDER — OXYCODONE HCL 5 MG/5ML PO SOLN: 5.0000 mg | Freq: Once | ORAL | Status: DC | PRN

## 2018-01-17 MED ORDER — OXYCODONE HCL 5 MG PO TABS: 5.0000 mg | ORAL_TABLET | Freq: Once | ORAL | Status: DC | PRN

## 2018-01-17 MED ORDER — LIDOCAINE HCL (CARDIAC) PF 100 MG/5ML IV SOSY
PREFILLED_SYRINGE | INTRAVENOUS | Status: DC | PRN
  Administered 2018-01-17: 40 mg via INTRAVENOUS

## 2018-01-17 MED ORDER — PROPOFOL 10 MG/ML IV BOLUS
INTRAVENOUS | Status: DC | PRN
  Administered 2018-01-17 (×2): 30 mg via INTRAVENOUS
  Administered 2018-01-17: 40 mg via INTRAVENOUS
  Administered 2018-01-17: 70 mg via INTRAVENOUS
  Administered 2018-01-17: 30 mg via INTRAVENOUS
  Administered 2018-01-17: 40 mg via INTRAVENOUS
  Administered 2018-01-17 (×3): 20 mg via INTRAVENOUS
  Administered 2018-01-17 (×2): 30 mg via INTRAVENOUS
  Administered 2018-01-17: 20 mg via INTRAVENOUS

## 2018-01-17 MED ORDER — SODIUM CHLORIDE 0.9 % IV SOLN: INTRAVENOUS | Status: DC

## 2018-01-17 MED ORDER — STERILE WATER FOR IRRIGATION IR SOLN
Status: DC | PRN
  Administered 2018-01-17: 09:00:00

## 2018-01-17 SURGICAL SUPPLY — 7 items
CANISTER SUCT 1200ML W/VALVE (MISCELLANEOUS) ×2 IMPLANT
GOWN CVR UNV OPN BCK APRN NK (MISCELLANEOUS) ×2 IMPLANT
GOWN ISOL THUMB LOOP REG UNIV (MISCELLANEOUS) ×2
KIT ENDO PROCEDURE OLY (KITS) ×2 IMPLANT
SNARE COLD EXACTO (MISCELLANEOUS) ×2 IMPLANT
TRAP ETRAP POLY (MISCELLANEOUS) ×2 IMPLANT
WATER STERILE IRR 250ML POUR (IV SOLUTION) ×2 IMPLANT

## 2018-01-17 NOTE — Op Note (Addendum)
[facility] Patient Name: Joel Humphrey Procedure Date: 01/17/2018 8:58 AM MRN: [id number] Account #: [id number] Date of Birth: April 17, 1961 Admit Type: Outpatient Age: 57 Room: [facility] OR ROOM 01 Gender: Male Note Status: Finalized Procedure:            Colonoscopy Indications:          Screening for colorectal malignant neoplasm Providers:            Lucilla Lame MD, MD Referring MD:         Halina Maidens, MD (Referring MD) Medicines:            Propofol per Anesthesia Complications:        No immediate complications. Procedure:            Pre-Anesthesia Assessment:                       - Prior to the procedure, a History and Physical was                        performed, and patient medications and allergies were                        reviewed. The patient's tolerance of previous                        anesthesia was also reviewed. The risks and benefits of                        the procedure and the sedation options and risks were                        discussed with the patient. All questions were                        answered, and informed consent was obtained. Prior                        Anticoagulants: The patient has taken no previous                        anticoagulant or antiplatelet agents. ASA Grade                        Assessment: II - A patient with mild systemic disease.                        After reviewing the risks and benefits, the patient was                        deemed in satisfactory condition to undergo the                        procedure.                       After obtaining informed consent, the colonoscope was                        passed under direct vision. Throughout the procedure,  the patient's blood pressure, pulse, and oxygen                        saturations were monitored continuously. The was                        introduced through the anus and advanced to the the             cecum, identified by appendiceal orifice and ileocecal                        valve. The colonoscopy was performed without                        difficulty. The patient tolerated the procedure well.                        The quality of the bowel preparation was good. Findings:      The perianal and digital rectal examinations were normal.      Two sessile polyps were found in the rectum. The polyps were 3 to 4 mm       in size. These polyps were removed with a cold snare. Resection and       retrieval were complete.      A 3 mm polyp was found in the sigmoid colon. The polyp was sessile. The       polyp was removed with a cold snare. Resection was complete, but the       polyp tissue was not retrieved. Impression:           - Two 3 to 4 mm polyps in the rectum, removed with a                        cold snare. Resected and retrieved.                       - One 3 mm polyp in the sigmoid colon, removed with a                        cold snare. Complete resection. Polyp tissue not                        retrieved. Recommendation:       - Discharge patient to home.                       - Resume previous diet.                       - Continue present medications.                       - Await pathology results.                       - Repeat colonoscopy in 5 years for surveillance. Procedure Code(s):    --- Professional ---                       [phone], Colonoscopy, flexible; with removal of tumor(s),  polyp(s), or other lesion(s) by snare technique Diagnosis Code(s):    --- Professional ---                       Z12.11, Encounter for screening for malignant neoplasm                        of colon                       K62.1, Rectal polyp                       D12.5, Benign neoplasm of sigmoid colon CPT copyright 2017 American Medical Association. All rights reserved. The codes documented in this report are preliminary and upon coder review may  be  revised to meet current compliance requirements. Lucilla Lame MD, MD 01/17/2018 9:26:06 AM This report has been signed electronically. Number of Addenda: 0 Note Initiated On: 01/17/2018 8:58 AM Scope Withdrawal Time: 0 hours 8 minutes 56 seconds  Total Procedure Duration: 0 hours 11 minutes 50 seconds       [facility]

## 2018-01-17 NOTE — Anesthesia Preprocedure Evaluation (Signed)
Anesthesia Evaluation  Patient identified by MRN, date of birth, ID band  Reviewed: NPO status   History of Anesthesia Complications Negative for: history of anesthetic complications  Airway Mallampati: II  TM Distance: >3 FB Neck ROM: full    Dental  (+) Partial Upper, Partial Lower   Pulmonary Current Smoker,    Pulmonary exam normal        Cardiovascular Exercise Tolerance: Good negative cardio ROS Normal cardiovascular exam     Neuro/Psych negative neurological ROS  negative psych ROS   GI/Hepatic negative GI ROS, Neg liver ROS,   Endo/Other  negative endocrine ROS  Renal/GU negative Renal ROS  negative genitourinary   Musculoskeletal   Abdominal   Peds  Hematology negative hematology ROS (+)   Anesthesia Other Findings tiva  Reproductive/Obstetrics                             Anesthesia Physical Anesthesia Plan  ASA: II  Anesthesia Plan: General   Post-op Pain Management:    Induction:   PONV Risk Score and Plan:   Airway Management Planned: Natural Airway  Additional Equipment:   Intra-op Plan:   Post-operative Plan:   Informed Consent: I have reviewed the patients History and Physical, chart, labs and discussed the procedure including the risks, benefits and alternatives for the proposed anesthesia with the patient or authorized representative who has indicated his/her understanding and acceptance.     Plan Discussed with: CRNA  Anesthesia Plan Comments:         Anesthesia Quick Evaluation

## 2018-01-17 NOTE — Anesthesia Postprocedure Evaluation (Signed)
Anesthesia Post Note  Patient: Joel Humphrey  Procedure(s) Performed: COLONOSCOPY WITH PROPOFOL WITH BIOPSIES (N/A Rectum) POLYPECTOMY (N/A Rectum)  Patient location during evaluation: PACU Anesthesia Type: General Level of consciousness: awake and alert Pain management: pain level controlled Vital Signs Assessment: post-procedure vital signs reviewed and stable Respiratory status: spontaneous breathing, nonlabored ventilation, respiratory function stable and patient connected to nasal cannula oxygen Cardiovascular status: blood pressure returned to baseline and stable Postop Assessment: no apparent nausea or vomiting Anesthetic complications: no    Arasely Akkerman

## 2018-01-17 NOTE — Transfer of Care (Signed)
Immediate Anesthesia Transfer of Care Note  Patient: Joel Humphrey  Procedure(s) Performed: COLONOSCOPY WITH PROPOFOL WITH BIOPSIES (N/A Rectum) POLYPECTOMY (N/A Rectum)  Patient Location: PACU  Anesthesia Type: General  Level of Consciousness: awake, alert  and patient cooperative  Airway and Oxygen Therapy: Patient Spontanous Breathing and Patient connected to supplemental oxygen  Post-op Assessment: Post-op Vital signs reviewed, Patient's Cardiovascular Status Stable, Respiratory Function Stable, Patent Airway and No signs of Nausea or vomiting  Post-op Vital Signs: Reviewed and stable  Complications: No apparent anesthesia complications

## 2018-01-17 NOTE — Anesthesia Procedure Notes (Signed)
Performed by: Lucile Hillmann, CRNA Pre-anesthesia Checklist: Patient identified, Emergency Drugs available, Suction available, Timeout performed and Patient being monitored Patient Re-evaluated:Patient Re-evaluated prior to induction Oxygen Delivery Method: Nasal cannula Placement Confirmation: positive ETCO2       

## 2018-01-17 NOTE — H&P (Signed)
Lucilla Lame, MD [facility] [address], [place], [place] [address] Phone: [phone] Fax : [phone]  Primary Care Physician:  [name], MD Primary Gastroenterologist:  Dr. Allen Norris  Pre-Procedure History & Physical: HPI:  Joel Humphrey is a 57 y.o. male is here for a screening colonoscopy.   Past Medical History:  Diagnosis Date  . AC (acromioclavicular) arthritis 02/17/2015  . BPH with obstruction/lower urinary tract symptoms 12/10/2017  . Combined fat and carbohydrate induced hyperlipemia 02/17/2015  . Erectile dysfunction 12/10/2017  . Plantar fasciitis 02/17/2015  . Tendinitis of elbow or forearm 02/17/2015  . Wears dentures    full upper, partial lower    Past Surgical History:  Procedure Laterality Date  . COLONOSCOPY  2010   @ [facility]    Prior to Admission medications   Medication Sig Start Date End Date Taking? Authorizing Provider  Multiple Vitamins-Minerals (MULTIVITAMIN MEN 50+ PO) Take by mouth daily.   Yes [provider]  sildenafil (REVATIO) 20 MG tablet Take 1 tablet (20 mg total) by mouth daily as needed. 12/10/17   [name], MD    Allergies as of 12/20/2017  . (No Known Allergies)    Family History  Problem Relation Age of Onset  . Hypertension Father   . Diabetes Father     Social History   Socioeconomic History  . Marital status: Married    Spouse name: Not on file  . Number of children: Not on file  . Years of education: Not on file  . Highest education level: Not on file  Occupational History  . Not on file  Social Needs  . Financial resource strain: Not on file  . Food insecurity:    Worry: Not on file    Inability: Not on file  . Transportation needs:    Medical: Not on file    Non-medical: Not on file  Tobacco Use  . Smoking status: Current Every Day Smoker    Packs/day: 0.33    Years: 25.00    Pack years: 8.25    Types: Cigarettes    Start date: 04/23/1990  . Smokeless tobacco: Never Used    Substance and Sexual Activity  . Alcohol use: Yes    Alcohol/week: 21.0 standard drinks    Types: 21 Standard drinks or equivalent per week  . Drug use: Not on file  . Sexual activity: Not on file  Lifestyle  . Physical activity:    Days per week: Not on file    Minutes per session: Not on file  . Stress: Not on file  Relationships  . Social connections:    Talks on phone: Not on file    Gets together: Not on file    Attends religious service: Not on file    Active member of club or organization: Not on file    Attends meetings of clubs or organizations: Not on file    Relationship status: Not on file  . Intimate partner violence:    Fear of current or ex partner: Not on file    Emotionally abused: Not on file    Physically abused: Not on file    Forced sexual activity: Not on file  Other Topics Concern  . Not on file  Social History Narrative  . Not on file    Review of Systems: See HPI, otherwise negative ROS  Physical Exam: BP (!) 141/93   Pulse (!) 58   Temp (!) 97.5 F (36.4 C) (Temporal)  Resp 16   Ht 5\' 8"  (1.727 m)   Wt 68 kg   SpO2 100%   BMI 22.81 kg/m  General:   Alert,  pleasant and cooperative in NAD Head:  Normocephalic and atraumatic. Neck:  Supple; no masses or thyromegaly. Lungs:  Clear throughout to auscultation.    Heart:  Regular rate and rhythm. Abdomen:  Soft, nontender and nondistended. Normal bowel sounds, without guarding, and without rebound.   Neurologic:  Alert and  oriented x4;  grossly normal neurologically.  Impression/Plan: Joel Humphrey is now here to undergo a screening colonoscopy.  Risks, benefits, and alternatives regarding colonoscopy have been reviewed with the patient.  Questions have been answered.  All parties agreeable.

## 2018-01-20 ENCOUNTER — Encounter: Payer: Self-pay | Admitting: Gastroenterology

## 2018-01-21 ENCOUNTER — Encounter: Payer: Self-pay | Admitting: Gastroenterology

## 2018-01-22 ENCOUNTER — Encounter: Payer: Self-pay | Admitting: Urology

## 2018-01-22 ENCOUNTER — Other Ambulatory Visit: Payer: BLUE CROSS/BLUE SHIELD | Admitting: Urology

## 2018-02-03 ENCOUNTER — Ambulatory Visit: Payer: BLUE CROSS/BLUE SHIELD | Admitting: Urology

## 2018-02-17 ENCOUNTER — Telehealth: Payer: Self-pay

## 2018-02-17 NOTE — Telephone Encounter (Signed)
Called pt, no answer. LM for pt informing him of the need to cancel his prostate bx as we do not have the u/s probe in office due to repair. Advised pt to call back for questions or concerns.

## 2018-02-18 ENCOUNTER — Other Ambulatory Visit: Payer: BLUE CROSS/BLUE SHIELD | Admitting: Urology

## 2018-03-03 DIAGNOSIS — M722 Plantar fascial fibromatosis: Secondary | ICD-10-CM | POA: Diagnosis not present

## 2018-03-03 DIAGNOSIS — D219 Benign neoplasm of connective and other soft tissue, unspecified: Secondary | ICD-10-CM | POA: Diagnosis not present

## 2018-03-05 ENCOUNTER — Ambulatory Visit: Payer: BLUE CROSS/BLUE SHIELD | Admitting: Urology

## 2018-03-10 ENCOUNTER — Other Ambulatory Visit: Payer: BLUE CROSS/BLUE SHIELD | Admitting: Urology

## 2018-03-12 ENCOUNTER — Other Ambulatory Visit: Payer: BLUE CROSS/BLUE SHIELD | Admitting: Urology

## 2018-03-12 ENCOUNTER — Encounter: Payer: Self-pay | Admitting: Urology

## 2018-03-14 ENCOUNTER — Telehealth: Payer: Self-pay | Admitting: Urology

## 2018-03-14 NOTE — Telephone Encounter (Signed)
He had an app on 03-12-18 but no showed for his biopsy   Sharyn Lull

## 2018-03-14 NOTE — Telephone Encounter (Signed)
Joel Humphrey needs his prostate biopsy rescheduled.  I see he has an appointment on 12/02 with Dr. Bernardo Heater, but that appointment is titled "Bx results."

## 2018-03-24 ENCOUNTER — Ambulatory Visit: Payer: BLUE CROSS/BLUE SHIELD | Admitting: Urology

## 2018-04-08 ENCOUNTER — Encounter: Payer: Self-pay | Admitting: Urology

## 2018-04-08 NOTE — Progress Notes (Signed)
Certified letter sent 04/09/2018

## 2018-05-08 DIAGNOSIS — M722 Plantar fascial fibromatosis: Secondary | ICD-10-CM | POA: Diagnosis not present

## 2018-05-08 DIAGNOSIS — M25561 Pain in right knee: Secondary | ICD-10-CM | POA: Diagnosis not present

## 2018-05-21 DIAGNOSIS — M25461 Effusion, right knee: Secondary | ICD-10-CM | POA: Diagnosis not present

## 2018-05-21 DIAGNOSIS — M1711 Unilateral primary osteoarthritis, right knee: Secondary | ICD-10-CM | POA: Diagnosis not present

## 2018-05-21 DIAGNOSIS — M25561 Pain in right knee: Secondary | ICD-10-CM | POA: Diagnosis not present

## 2018-05-21 DIAGNOSIS — G8929 Other chronic pain: Secondary | ICD-10-CM | POA: Diagnosis not present

## 2018-05-21 DIAGNOSIS — M170 Bilateral primary osteoarthritis of knee: Secondary | ICD-10-CM | POA: Diagnosis not present

## 2018-06-03 DIAGNOSIS — M722 Plantar fascial fibromatosis: Secondary | ICD-10-CM | POA: Diagnosis not present

## 2018-06-03 DIAGNOSIS — D219 Benign neoplasm of connective and other soft tissue, unspecified: Secondary | ICD-10-CM | POA: Diagnosis not present

## 2018-06-03 DIAGNOSIS — Z6823 Body mass index (BMI) 23.0-23.9, adult: Secondary | ICD-10-CM | POA: Diagnosis not present

## 2019-04-08 DIAGNOSIS — M79672 Pain in left foot: Secondary | ICD-10-CM | POA: Diagnosis not present

## 2019-04-08 DIAGNOSIS — M79671 Pain in right foot: Secondary | ICD-10-CM | POA: Diagnosis not present

## 2020-11-16 DIAGNOSIS — H52203 Unspecified astigmatism, bilateral: Secondary | ICD-10-CM | POA: Diagnosis not present

## 2020-11-16 DIAGNOSIS — H524 Presbyopia: Secondary | ICD-10-CM | POA: Diagnosis not present

## 2020-11-16 DIAGNOSIS — H5213 Myopia, bilateral: Secondary | ICD-10-CM | POA: Diagnosis not present

## 2020-12-05 ENCOUNTER — Ambulatory Visit
Admission: EM | Admit: 2020-12-05 | Discharge: 2020-12-05 | Disposition: A | Discharge status: Home / Self Care | Payer: BLUE CROSS/BLUE SHIELD | Attending: Physician Assistant | Admitting: Physician Assistant

## 2020-12-05 ENCOUNTER — Other Ambulatory Visit: Payer: Self-pay

## 2020-12-05 ENCOUNTER — Encounter: Payer: Self-pay | Admitting: Emergency Medicine

## 2020-12-05 DIAGNOSIS — R195 Other fecal abnormalities: Secondary | ICD-10-CM

## 2020-12-05 DIAGNOSIS — M25561 Pain in right knee: Secondary | ICD-10-CM

## 2020-12-05 DIAGNOSIS — Z20822 Contact with and (suspected) exposure to covid-19: Secondary | ICD-10-CM

## 2020-12-05 DIAGNOSIS — H6122 Impacted cerumen, left ear: Secondary | ICD-10-CM

## 2020-12-05 LAB — SARS CORONAVIRUS 2 (TAT 6-24 HRS): SARS Coronavirus 2: NEGATIVE

## 2020-12-05 MED ORDER — MELOXICAM 15 MG PO TABS: 15.0000 mg | ORAL_TABLET | Freq: Every day | ORAL | 0 refills | Status: AC

## 2020-12-05 NOTE — ED Triage Notes (Signed)
Pt c/o left ear fullness, loose stools, and joint pain. Started about 5 days ago. He states he was exposed to covid by a coworker about 5 days ago. He states he has done home test and was negative.

## 2020-12-05 NOTE — Discharge Instructions (Addendum)
IMPACTED EAR WAX: We have clean the earwax from your ear today.  There is no evidence of infection.  You can use over-the-counter Debrox solution to help soften wax if this happens again.  We should follow-up here if you start to have any pain of your ear.  RIGHT KNEE PAIN/SWELLING: Your knee pain and swelling are likely related to flareup of underlying arthritis.  You should ice and elevate the knee.  You also take extra strength Tylenol.  You can use over-the-counter Voltaren gel as well.  I have sent an anti-inflammatory medication for you to.  If not improving or if worsening you may need to go to [facility] walk-in urgent care in [place] for evaluation.   COVID TESTING/EXPOSURE:  You have received COVID testing today either for positive exposure, concerning symptoms that could be related to COVID infection, screening purposes, or re-testing after confirmed positive.  Your test obtained today checks for active viral infection in the last 1-2 weeks. If your test is negative now, you can still test positive later. So, if you do develop symptoms you should either get re-tested and/or isolate x 5 days and then strict mask use x 5 days (unvaccinated) or mask use x 10 days (vaccinated). Please follow CDC guidelines.  While Rapid antigen tests come back in 15-20 minutes, send out PCR/molecular test results typically come back within 1-3 days. In the mean time, if you are symptomatic, assume this could be a positive test and treat/monitor yourself as if you do have COVID.   We will call with test results if positive. Please download the MyChart app and set up a profile to access test results.   If symptomatic, go home and rest. Push fluids. Take Tylenol as needed for discomfort. Gargle warm salt water. Throat lozenges. Take Mucinex DM or Robitussin for cough. Humidifier in bedroom to ease coughing. Warm showers. Also review the COVID handout for more information.  COVID-19 INFECTION: The incubation  period of COVID-19 is approximately 14 days after exposure, with most symptoms developing in roughly 4-5 days. Symptoms may range in severity from mild to critically severe. Roughly 80% of those infected will have mild symptoms. People of any age may become infected with COVID-19 and have the ability to transmit the virus. The most common symptoms include: fever, fatigue, cough, body aches, headaches, sore throat, nasal congestion, shortness of breath, nausea, vomiting, diarrhea, changes in smell and/or taste.    COURSE OF ILLNESS Some patients may begin with mild disease which can progress quickly into critical symptoms. If your symptoms are worsening please call ahead to the Emergency Department and proceed there for further treatment. Recovery time appears to be roughly 1-2 weeks for mild symptoms and 3-6 weeks for severe disease.   GO IMMEDIATELY TO ER FOR FEVER YOU ARE UNABLE TO GET DOWN WITH TYLENOL, BREATHING PROBLEMS, CHEST PAIN, FATIGUE, LETHARGY, INABILITY TO EAT OR DRINK, ETC  QUARANTINE AND ISOLATION: To help decrease the spread of COVID-19 please remain isolated if you have COVID infection or are highly suspected to have COVID infection. This means -stay home and isolate to one room in the home if you live with others. Do not share a bed or bathroom with others while ill, sanitize and wipe down all countertops and keep common areas clean and disinfected. Stay home for 5 days. If you have no symptoms or your symptoms are resolving after 5 days, you can leave your house. Continue to wear a mask around others for 5 additional days. If  you have been in close contact (within 6 feet) of someone diagnosed with COVID 19, you are advised to quarantine in your home for 14 days as symptoms can develop anywhere from 2-14 days after exposure to the virus. If you develop symptoms, you  must isolate.  Most current guidelines for COVID after exposure -unvaccinated: isolate 5 days and strict mask use x 5 days.  Test on day 5 is possible -vaccinated: wear mask x 10 days if symptoms do not develop -You do not necessarily need to be tested for COVID if you have + exposure and  develop symptoms. Just isolate at home x10 days from symptom onset During this global pandemic, CDC advises to practice social distancing, try to stay at least 20f away from others at all times. Wear a face covering. Wash and sanitize your hands regularly and avoid going anywhere that is not necessary.  KEEP IN MIND THAT THE COVID TEST IS NOT 100% ACCURATE AND YOU SHOULD STILL DO EVERYTHING TO PREVENT POTENTIAL SPREAD OF VIRUS TO OTHERS (WEAR MASK, WEAR GLOVES, [place] AND SANITIZE REGULARLY). IF INITIAL TEST IS NEGATIVE, THIS MAY NOT MEAN YOU ARE DEFINITELY NEGATIVE. MOST ACCURATE TESTING IS DONE 5-7 DAYS AFTER EXPOSURE.   It is not advised by CDC to get re-tested after receiving a positive COVID test since you can still test positive for weeks to months after you have already cleared the virus.   *If you have not been vaccinated for COVID, I strongly suggest you consider getting vaccinated as long as there are no contraindications.

## 2020-12-05 NOTE — ED Provider Notes (Signed)
[facility]    CSN: [id number] Arrival date & time: 12/05/20  1012      History   Chief Complaint Chief Complaint  Patient presents with   Otalgia    left   Covid Exposure    HPI Joel Humphrey is a 60 y.o. male presenting for left-sided ear fullness and pressure over the past few days.  He denies any associated pain or discharge.  Additionally patient complains of right knee pain x5 days.  States he has occasional problems with his knee.  He says he works long hours especially at night.  He has pain of the medial knee and some swelling.  No specific injury.  He has not taken any medication for symptoms.  Denies any severe pain.  No weakness.  Also, he complains of fatigue and loose stools x6 days.  Patient reports that he was exposed to COVID-19 through a coworker about a week ago.  He is concerned for that and would like to be tested.  States he took an at home COVID test which was expired for 2 days and the result was negative.  He has been vaccinated for COVID-19 x2 and received his COVID booster last week about the time his fatigue and loose stools started.  He has no other complaints.  HPI  Past Medical History:  Diagnosis Date   AC (acromioclavicular) arthritis 02/17/2015   BPH with obstruction/lower urinary tract symptoms 12/10/2017   Combined fat and carbohydrate induced hyperlipemia 02/17/2015   Erectile dysfunction 12/10/2017   Plantar fasciitis 02/17/2015   Tendinitis of elbow or forearm 02/17/2015   Wears dentures    full upper, partial lower    Patient Active Problem List   Diagnosis Date Noted   Special screening for malignant neoplasms, colon    Rectal polyp    Polyp of sigmoid colon    Erectile dysfunction 12/10/2017   BPH with obstruction/lower urinary tract symptoms 12/10/2017   AC (acromioclavicular) arthritis 02/17/2015   History of colon polyps 02/17/2015   Combined fat and carbohydrate induced hyperlipemia 02/17/2015   Plantar fasciitis  02/17/2015   Tendinitis of elbow or forearm 02/17/2015   Compulsive tobacco user syndrome 02/17/2015    Past Surgical History:  Procedure Laterality Date   COLONOSCOPY  2010   @ [facility]   COLONOSCOPY WITH PROPOFOL N/A 01/17/2018   Procedure: COLONOSCOPY WITH PROPOFOL WITH BIOPSIES;  Surgeon: Lucilla Lame, MD;  Location: [place];  Service: Endoscopy;  Laterality: N/A;   POLYPECTOMY N/A 01/17/2018   Procedure: POLYPECTOMY;  Surgeon: Lucilla Lame, MD;  Location: [place];  Service: Endoscopy;  Laterality: N/A;       Home Medications    Prior to Admission medications   Medication Sig Start Date End Date Taking? Authorizing Provider  meloxicam (MOBIC) 15 MG tablet Take 1 tablet (15 mg total) by mouth daily for 15 days. 12/05/20 12/20/20 Yes Danton Clap, PA-C  Multiple Vitamins-Minerals (MULTIVITAMIN MEN 50+ PO) Take by mouth daily.    [provider]  sildenafil (REVATIO) 20 MG tablet Take 1 tablet (20 mg total) by mouth daily as needed. 12/10/17   [name], MD    Family History Family History  Problem Relation Age of Onset   Hypertension Father    Diabetes Father     Social History Social History   Tobacco Use   Smoking status: Every Day    Packs/day: 0.33    Years: 25.00    Pack years: 8.25  Types: Cigarettes    Start date: 04/23/1990   Smokeless tobacco: Never  Vaping Use   Vaping Use: Never used  Substance Use Topics   Alcohol use: Yes    Alcohol/week: 21.0 standard drinks    Types: 21 Standard drinks or equivalent per week   Drug use: Not Currently     Allergies   Patient has no known allergies.   Review of Systems Review of Systems  Constitutional:  Positive for fatigue. Negative for fever.  HENT:  Negative for congestion, ear discharge, ear pain, hearing loss, rhinorrhea, sinus pressure, sinus pain and sore throat.        L ear fullness  Respiratory:  Negative for cough and shortness of breath.   Gastrointestinal:   Positive for diarrhea. Negative for abdominal pain, nausea and vomiting.  Musculoskeletal:  Positive for arthralgias (Right knee pain and swelling). Negative for myalgias.  Neurological:  Negative for weakness, light-headedness and headaches.  Hematological:  Negative for adenopathy.    Physical Exam Triage Vital Signs ED Triage Vitals  Enc Vitals Group     BP      Pulse      Resp      Temp      Temp src      SpO2      Weight      Height      Head Circumference      Peak Flow      Pain Score      Pain Loc      Pain Edu?      Excl. in [place]?    No data found.  Updated Vital Signs BP (!) 141/90 (BP Location: Left Arm)   Pulse 72   Temp 98.3 F (36.8 C) (Oral)   Resp 18   Ht '5\' 8"'$  (1.727 m)   Wt 149 lb 14.6 oz (68 kg)   SpO2 97%   BMI 22.79 kg/m      Physical Exam Vitals and nursing note reviewed.  Constitutional:      General: He is not in acute distress.    Appearance: Normal appearance. He is well-developed. He is not ill-appearing or diaphoretic.  HENT:     Head: Normocephalic and atraumatic.     Right Ear: Hearing, tympanic membrane, ear canal and external ear normal.     Left Ear: Hearing and external ear normal. There is impacted cerumen.     Nose: Nose normal.     Mouth/Throat:     Mouth: Mucous membranes are moist.     Pharynx: Oropharynx is clear. Uvula midline.     Tonsils: No tonsillar abscesses.  Eyes:     General: No scleral icterus.       Right eye: No discharge.        Left eye: No discharge.     Conjunctiva/sclera: Conjunctivae normal.  Neck:     Thyroid: No thyromegaly.     Trachea: No tracheal deviation.  Cardiovascular:     Rate and Rhythm: Normal rate and regular rhythm.     Heart sounds: Normal heart sounds.  Pulmonary:     Effort: Pulmonary effort is normal. No respiratory distress.     Breath sounds: Normal breath sounds. No wheezing, rhonchi or rales.  Musculoskeletal:     Cervical back: Neck supple.     Right knee: Swelling  (mild anterior knee swelling) and crepitus present. No effusion, erythema or ecchymosis. Normal range of motion. Tenderness present over the medial joint line. Normal  pulse.  Skin:    General: Skin is warm and dry.     Findings: No rash.  Neurological:     General: No focal deficit present.     Mental Status: He is alert.     Motor: No weakness.     Gait: Gait normal.  Psychiatric:        Mood and Affect: Mood normal.        Thought Content: Thought content normal.     UC Treatments / Results  Labs (all labs ordered are listed, but only abnormal results are displayed) Labs Reviewed  SARS CORONAVIRUS 2 (TAT 6-24 HRS)    EKG   Radiology No results found.  Procedures Procedures (including critical care time)  Medications Ordered in UC Medications - No data to display  Initial Impression / Assessment and Plan / UC Course  I have reviewed the triage vital signs and the nursing notes.  Pertinent labs & imaging results that were available during my care of the patient were reviewed by me and considered in my medical decision making (see chart for details).  60 year old male presenting for multiple complaints including left-sided ear pressure and fullness.  Exam reveals impacted cerumen of the left EAC.  Otic lavage performed by nursing staff.  Otoscopic examination reveals clear EAC and normal-appearing TM.  Patient also reports right-sided knee pain and swelling.  He has tenderness of the medial aspect of the knee and mild swelling.  Full motion of the knee.  This is likely flareup of underlying osteoarthritis.  Supportive care encouraged with following RICE guidelines, use of Voltaren gel and extra strength Tylenol and meloxicam.  Advised following up with [facility] if not improving with this treatment plan.  Patient concerned about exposure to COVID-19.  He was recently boosted last week.  The only symptoms he reports that could be possibly related to COVID-19 infection or  booster would be loose stools and some fatigue.  Reviewed current CDC guidelines, isolation protocol and ED precautions with patient.  He reports the symptoms over the last 6 days.  Advised him that he can return to work tonight since he would like to stand he is out of the 5-day isolation.  But I encouraged him to wear his mask.  Advised to follow-up with [place] as needed.  Final Clinical Impressions(s) / UC Diagnoses   Final diagnoses:  Impacted cerumen of left ear  Acute pain of right knee  Loose stools  Exposure to COVID-19 virus     Discharge Instructions      IMPACTED EAR WAX: We have clean the earwax from your ear today.  There is no evidence of infection.  You can use over-the-counter Debrox solution to help soften wax if this happens again.  We should follow-up here if you start to have any pain of your ear.  RIGHT KNEE PAIN/SWELLING: Your knee pain and swelling are likely related to flareup of underlying arthritis.  You should ice and elevate the knee.  You also take extra strength Tylenol.  You can use over-the-counter Voltaren gel as well.  I have sent an anti-inflammatory medication for you to.  If not improving or if worsening you may need to go to [facility] walk-in urgent care in [place] for evaluation.   COVID TESTING/EXPOSURE:  You have received COVID testing today either for positive exposure, concerning symptoms that could be related to COVID infection, screening purposes, or re-testing after confirmed positive.  Your test obtained today checks for active viral infection in  the last 1-2 weeks. If your test is negative now, you can still test positive later. So, if you do develop symptoms you should either get re-tested and/or isolate x 5 days and then strict mask use x 5 days (unvaccinated) or mask use x 10 days (vaccinated). Please follow CDC guidelines.  While Rapid antigen tests come back in 15-20 minutes, send out PCR/molecular test results typically come back within  1-3 days. In the mean time, if you are symptomatic, assume this could be a positive test and treat/monitor yourself as if you do have COVID.   We will call with test results if positive. Please download the MyChart app and set up a profile to access test results.   If symptomatic, go home and rest. Push fluids. Take Tylenol as needed for discomfort. Gargle warm salt water. Throat lozenges. Take Mucinex DM or Robitussin for cough. Humidifier in bedroom to ease coughing. Warm showers. Also review the COVID handout for more information.  COVID-19 INFECTION: The incubation period of COVID-19 is approximately 14 days after exposure, with most symptoms developing in roughly 4-5 days. Symptoms may range in severity from mild to critically severe. Roughly 80% of those infected will have mild symptoms. People of any age may become infected with COVID-19 and have the ability to transmit the virus. The most common symptoms include: fever, fatigue, cough, body aches, headaches, sore throat, nasal congestion, shortness of breath, nausea, vomiting, diarrhea, changes in smell and/or taste.    COURSE OF ILLNESS Some patients may begin with mild disease which can progress quickly into critical symptoms. If your symptoms are worsening please call ahead to the Emergency Department and proceed there for further treatment. Recovery time appears to be roughly 1-2 weeks for mild symptoms and 3-6 weeks for severe disease.   GO IMMEDIATELY TO ER FOR FEVER YOU ARE UNABLE TO GET DOWN WITH TYLENOL, BREATHING PROBLEMS, CHEST PAIN, FATIGUE, LETHARGY, INABILITY TO EAT OR DRINK, ETC  QUARANTINE AND ISOLATION: To help decrease the spread of COVID-19 please remain isolated if you have COVID infection or are highly suspected to have COVID infection. This means -stay home and isolate to one room in the home if you live with others. Do not share a bed or bathroom with others while ill, sanitize and wipe down all countertops and keep common  areas clean and disinfected. Stay home for 5 days. If you have no symptoms or your symptoms are resolving after 5 days, you can leave your house. Continue to wear a mask around others for 5 additional days. If you have been in close contact (within 6 feet) of someone diagnosed with COVID 19, you are advised to quarantine in your home for 14 days as symptoms can develop anywhere from 2-14 days after exposure to the virus. If you develop symptoms, you  must isolate.  Most current guidelines for COVID after exposure -unvaccinated: isolate 5 days and strict mask use x 5 days. Test on day 5 is possible -vaccinated: wear mask x 10 days if symptoms do not develop -You do not necessarily need to be tested for COVID if you have + exposure and  develop symptoms. Just isolate at home x10 days from symptom onset During this global pandemic, CDC advises to practice social distancing, try to stay at least 33f away from others at all times. Wear a face covering. Wash and sanitize your hands regularly and avoid going anywhere that is not necessary.  KEEP IN MIND THAT THE COVID TEST IS NOT 100%  ACCURATE AND YOU SHOULD STILL DO EVERYTHING TO PREVENT POTENTIAL SPREAD OF VIRUS TO OTHERS (WEAR MASK, WEAR GLOVES, [place] HANDS AND SANITIZE REGULARLY). IF INITIAL TEST IS NEGATIVE, THIS MAY NOT MEAN YOU ARE DEFINITELY NEGATIVE. MOST ACCURATE TESTING IS DONE 5-7 DAYS AFTER EXPOSURE.   It is not advised by CDC to get re-tested after receiving a positive COVID test since you can still test positive for weeks to months after you have already cleared the virus.   *If you have not been vaccinated for COVID, I strongly suggest you consider getting vaccinated as long as there are no contraindications.       ED Prescriptions     Medication Sig Dispense Auth. Provider   meloxicam (MOBIC) 15 MG tablet Take 1 tablet (15 mg total) by mouth daily for 15 days. 15 tablet Gretta Cool      PDMP not reviewed this encounter.    Danton Clap, PA-C 12/05/20 1120

## 2021-02-13 DIAGNOSIS — H18053 Posterior corneal pigmentations, bilateral: Secondary | ICD-10-CM | POA: Diagnosis not present

## 2021-02-13 DIAGNOSIS — H40003 Preglaucoma, unspecified, bilateral: Secondary | ICD-10-CM | POA: Diagnosis not present

## 2021-02-13 DIAGNOSIS — H21233 Degeneration of iris (pigmentary), bilateral: Secondary | ICD-10-CM | POA: Diagnosis not present

## 2021-07-11 ENCOUNTER — Ambulatory Visit
Admission: RE | Admit: 2021-07-11 | Discharge: 2021-07-11 | Disposition: A | Discharge status: Home / Self Care | Payer: BC Managed Care – PPO | Source: Ambulatory Visit | Attending: Family Medicine | Admitting: Family Medicine

## 2021-07-11 ENCOUNTER — Other Ambulatory Visit: Payer: Self-pay

## 2021-07-11 ENCOUNTER — Ambulatory Visit
Admission: RE | Admit: 2021-07-11 | Discharge: 2021-07-11 | Disposition: A | Discharge status: Home / Self Care | Payer: BC Managed Care – PPO | Attending: Family Medicine | Admitting: Family Medicine

## 2021-07-11 ENCOUNTER — Ambulatory Visit (INDEPENDENT_AMBULATORY_CARE_PROVIDER_SITE_OTHER): Payer: BLUE CROSS/BLUE SHIELD | Admitting: Family Medicine

## 2021-07-11 ENCOUNTER — Encounter: Payer: Self-pay | Admitting: Family Medicine

## 2021-07-11 ENCOUNTER — Inpatient Hospital Stay (INDEPENDENT_AMBULATORY_CARE_PROVIDER_SITE_OTHER): Payer: Self-pay | Admitting: Radiology

## 2021-07-11 VITALS — BP 138/88 | HR 70 | Ht 68.0 in | Wt 168.6 lb

## 2021-07-11 DIAGNOSIS — M1712 Unilateral primary osteoarthritis, left knee: Secondary | ICD-10-CM | POA: Diagnosis not present

## 2021-07-11 DIAGNOSIS — M25462 Effusion, left knee: Secondary | ICD-10-CM | POA: Insufficient documentation

## 2021-07-11 DIAGNOSIS — M25562 Pain in left knee: Secondary | ICD-10-CM

## 2021-07-11 DIAGNOSIS — G8929 Other chronic pain: Secondary | ICD-10-CM

## 2021-07-11 DIAGNOSIS — M25561 Pain in right knee: Secondary | ICD-10-CM | POA: Diagnosis not present

## 2021-07-11 MED ORDER — TRIAMCINOLONE ACETONIDE 40 MG/ML IJ SUSP
40.0000 mg | Freq: Once | INTRAMUSCULAR | Status: AC
  Administered 2021-07-11: 40 mg via INTRAMUSCULAR

## 2021-07-11 MED ORDER — MELOXICAM 15 MG PO TABS: 15.0000 mg | ORAL_TABLET | Freq: Every day | ORAL | 0 refills | Status: DC

## 2021-07-11 NOTE — Assessment & Plan Note (Signed)
Patient with atraumatic roughly 1 year history of left anteromedial knee pain in the setting of prolonged weightbearing (work related tasks, etc.).  He has noted severe progressive worsening of the symptoms over the past week associated with swelling.  Denies any specific change in activity at the onset of worsening symptoms.  Treatments have included relative rest, ibuprofen 800 mg, and OTC Voltaren gel, all with limited response.  Pain noted with weightbearing, denies any instability, no mechanical symptoms, no buckling/near buckling. ? ?Examination reveals 1+ effusion left knee, tenderness about the lateral patellar facet, range of motion limited to 110 degrees with flexion due to pain, tenderness at the medial joint line, no laxity with anterior/posterior drawer, no laxity with valgus/varus stressing, McMurray's test benign, Lachman benign. ? ?Clinical findings are most consistent with osteoarthritis related arthralgia and associated effusion.  Given his stated history, treatments to date, patient did elect to proceed with ultrasound-guided aspiration followed by corticosteroid injection.  Additionally will be placed on scheduled meloxicam for comorbid right knee symptoms, and dedicated knee x-rays bilaterally.  Close follow-up in 2 weeks to assess response and determine next steps. ? ?Chronic condition, exacerbation/symptomatic, Rx management ?

## 2021-07-11 NOTE — Assessment & Plan Note (Signed)
Patient with recently noted right medial knee pain over the past several weeks in the setting of chronic left knee pain.  Examination shows minimal tenderness at the medial joint line, otherwise he demonstrates full range of motion that is painless, negative provocative testing. ? ?Given his overall clinical picture, this most likely represents secondary/compensatory right knee arthralgia due to comorbid left knee chronic pain with concern for underlying osteoarthritis as primary etiology. ? ?We will plan for dedicated x-rays of the right knee, scheduled meloxicam, and close follow-up in 2 weeks. ?

## 2021-07-11 NOTE — Patient Instructions (Signed)
You have just been given a cortisone injection to reduce pain and inflammation. After the injection you may notice immediate relief of pain as a result of the Lidocaine. It is important to rest the area of the injection for 24 to 48 hours after the injection. There is a possibility of some temporary increased discomfort and swelling for up to 72 hours until the cortisone begins to work. If you do have pain, simply rest the joint and use ice. If you can tolerate over the counter medications, you can try Tylenol, Aleve, or Advil for added relief per package instructions. ?- Obtain x-rays today ?-As above, relative rest x2 days then gradual return to normal activity ?- Dose meloxicam once daily with food until follow-up ?- Return for follow-up in 2 weeks ?- Contact our office for any questions between now and then ? ?

## 2021-07-11 NOTE — Assessment & Plan Note (Signed)
See additional assessment(s) for plan details. 

## 2021-07-11 NOTE — Progress Notes (Signed)
?  ? ?Primary Care / Sports Medicine Office Visit ? ?Patient Information:  ?Patient ID: [name], male DOB: 22-Jun-1960 Age: 61 y.o. MRN: [id number]  ? ?Joel Humphrey is a pleasant 61 y.o. male presenting with the following: ? ?Chief Complaint  ?Patient presents with  ? Knee Pain  ?  Left for 1 week this weekend has been the worst, no known injury, no xray, Advil and Voltaren gel with no relief, rest is the only thing that helps.   ? ? ?Vitals:  ? 07/11/21 0919  ?BP: 138/88  ?Pulse: 70  ?SpO2: 97%  ? ?Vitals:  ? 07/11/21 0919  ?Weight: 168 lb 9.6 oz (76.5 kg)  ?Height: '5\' 8"'$  (1.727 m)  ? ?Body mass index is 25.64 kg/m?. ? ?No results found.  ? ?Independent interpretation of notes and tests performed by another provider:  ? ?None ? ?Procedures performed:  ? ?Procedure: Aspiration/injection of left suprapatellar knee under ultrasound guidance. ?Ultrasound guidance utilized for in-plane suprapatellar approach to left knee, 1+ effusion noted, confirmation of resolution of effusion noted after aspiration ?Samsung HS60 device utilized with permanent recording / reporting. ?Verbal informed consent obtained and verified. ?Skin prepped in a sterile fashion. ?Ethyl chloride for topical local analgesia.  ?Completed without difficulty and tolerated well. ?Aspirate: 11 mL straw-colored benign-appearing fluid ?Medication: triamcinolone acetonide 40 mg/mL suspension for injection 1 mL total and 2 mL lidocaine 1% without epinephrine utilized for needle placement anesthetic ?Advised to contact for fevers/chills, erythema, induration, drainage, or persistent bleeding.  ? ?Pertinent History, Exam, Impression, and Recommendations:  ? ?Chronic pain of left knee ?Patient with atraumatic roughly 1 year history of left anteromedial knee pain in the setting of prolonged weightbearing (work related tasks, etc.).  He has noted severe progressive worsening of the symptoms over the past week associated with swelling.  Denies any specific  change in activity at the onset of worsening symptoms.  Treatments have included relative rest, ibuprofen 800 mg, and OTC Voltaren gel, all with limited response.  Pain noted with weightbearing, denies any instability, no mechanical symptoms, no buckling/near buckling. ? ?Examination reveals 1+ effusion left knee, tenderness about the lateral patellar facet, range of motion limited to 110 degrees with flexion due to pain, tenderness at the medial joint line, no laxity with anterior/posterior drawer, no laxity with valgus/varus stressing, McMurray's test benign, Lachman benign. ? ?Clinical findings are most consistent with osteoarthritis related arthralgia and associated effusion.  Given his stated history, treatments to date, patient did elect to proceed with ultrasound-guided aspiration followed by corticosteroid injection.  Additionally will be placed on scheduled meloxicam for comorbid right knee symptoms, and dedicated knee x-rays bilaterally.  Close follow-up in 2 weeks to assess response and determine next steps. ? ?Chronic condition, exacerbation/symptomatic, Rx management ? ?Knee effusion, left ?See additional assessment(s) for plan details. ? ?Arthralgia of right knee ?Patient with recently noted right medial knee pain over the past several weeks in the setting of chronic left knee pain.  Examination shows minimal tenderness at the medial joint line, otherwise he demonstrates full range of motion that is painless, negative provocative testing. ? ?Given his overall clinical picture, this most likely represents secondary/compensatory right knee arthralgia due to comorbid left knee chronic pain with concern for underlying osteoarthritis as primary etiology. ? ?We will plan for dedicated x-rays of the right knee, scheduled meloxicam, and close follow-up in 2 weeks.  ? ?Orders & Medications ?Meds ordered this encounter  ?Medications  ? meloxicam (MOBIC) 15 MG tablet  ?  Sig: Take 1 tablet (15 mg total) by mouth  daily.  ?  Dispense:  30 tablet  ?  Refill:  0  ? ?Orders Placed This Encounter  ?Procedures  ? [place] LIMITED JOINT SPACE STRUCTURES LOW LEFT  ? DG Knee Complete 4 Views Left  ? DG Knee Complete 4 Views Right  ?  ? ?Return in about 2 weeks (around 07/25/2021).  ?  ? ?Montel Culver, MD ? ? Primary Care Sports Medicine ?[facility] ?[place]  ? ?

## 2021-07-13 ENCOUNTER — Telehealth: Payer: Self-pay

## 2021-07-13 NOTE — Telephone Encounter (Signed)
Patient informed on VM to pick up letter from office after lunch. Letter ready.  ?

## 2021-07-13 NOTE — Telephone Encounter (Signed)
Pt returned the office call. Made pt aware that letter is ready. Pt will come in to pick up.  ?

## 2021-07-13 NOTE — Telephone Encounter (Signed)
Copied from [facility] [id number]. Topic: General - Other ?>> Jul 13, 2021  8:25 AM Leward Quan A wrote: ?Reason for CRM: Patient called in to inquire of Dr Zigmund Daniel if he can get an extension on his note to return to work since his knee is still not fully back to normal would like to be out today and the weekend and possibly return to work on Monday 07/17/21. Asking to pick up note at office today please can be  reached at Ph# [phone] ?

## 2021-07-25 ENCOUNTER — Ambulatory Visit (INDEPENDENT_AMBULATORY_CARE_PROVIDER_SITE_OTHER): Payer: BC Managed Care – PPO | Admitting: Family Medicine

## 2021-07-25 ENCOUNTER — Encounter: Payer: Self-pay | Admitting: Family Medicine

## 2021-07-25 VITALS — BP 140/90 | HR 88 | Ht 68.0 in | Wt 163.0 lb

## 2021-07-25 DIAGNOSIS — M1712 Unilateral primary osteoarthritis, left knee: Secondary | ICD-10-CM | POA: Diagnosis not present

## 2021-07-25 DIAGNOSIS — M1711 Unilateral primary osteoarthritis, right knee: Secondary | ICD-10-CM | POA: Insufficient documentation

## 2021-07-25 DIAGNOSIS — M170 Bilateral primary osteoarthritis of knee: Secondary | ICD-10-CM

## 2021-07-25 MED ORDER — CELECOXIB 100 MG PO CAPS: 100.0000 mg | ORAL_CAPSULE | Freq: Two times a day (BID) | ORAL | 1 refills | Status: AC | PRN

## 2021-07-25 NOTE — Progress Notes (Signed)
?  ? ?  Primary Care / Sports Medicine Office Visit ? ?Patient Information:  ?Patient ID: [name], male DOB: [birth year] Age: 61 y.o. MRN: [id number]  ? ?Joel Humphrey is a pleasant 61 y.o. male presenting with the following: ? ?Chief Complaint  ?Patient presents with  ? Knee Pain  ?  Left knee, still same, no swollen but painful. Rest, medication, and wrap helps.   ? ? ?Vitals:  ? 07/25/21 0915  ?BP: 140/90  ?Pulse: 88  ?SpO2: 96%  ? ?Vitals:  ? 07/25/21 0915  ?Weight: 163 lb (73.9 kg)  ?Height: '5\' 8"'$  (1.727 m)  ? ?Body mass index is 24.78 kg/m?. ? ?  ? ?Independent interpretation of notes and tests performed by another provider:  ? ?Independent interpretation of bilateral knee x-rays recently obtained reveals moderate-severe medial tibiofemoral osteoarthritis bilaterally, secondarily patellofemoral mild-moderate degenerative changes noted, no acute osseous abnormalities noted. ? ?Procedures performed:  ? ?None ? ?Pertinent History, Exam, Impression, and Recommendations:  ? ?Primary osteoarthritis of left knee ?Patient returns for follow-up to left knee pain, at last visit on 07/11/21 he did undergo aspiration followed by cortisone injected. Today he reports roughly 70% improvement in symptoms and continues meloxicam and knee sleeve. ? ?Examination does show tenderness at the medial joint line, resolution of effusion, otherwise normal findings. His x-rays show comparable degenerative changes on bilateral knees. ? ?Given excellent progress, I have advised start of formal PT, trial transition from meloxicam to PRN Celebrex, and we will seek authorization for viscosupplementation. ? ?Primary osteoarthritis of right knee ?Per recent x-rays, comparable degenerative changes noted on his minimally symptomatic knee. He will start PT to preserve symptom control.  ? ?Orders & Medications ?Meds ordered this encounter  ?Medications  ? celecoxib (CELEBREX) 100 MG capsule  ?  Sig: Take 1 capsule (100 mg total) by mouth 2 (two)  times daily as needed.  ?  Dispense:  60 capsule  ?  Refill:  1  ? ?Orders Placed This Encounter  ?Procedures  ? Ambulatory referral to Physical Therapy  ?  ? ?Return if symptoms worsen or fail to improve.  ?  ? ?Montel Culver, MD ? ? Primary Care Sports Medicine ?[facility] ?[place]  ? ?

## 2021-07-25 NOTE — Assessment & Plan Note (Signed)
Patient returns for follow-up to left knee pain, at last visit on 07/11/21 he did undergo aspiration followed by cortisone injected. Today he reports roughly 70% improvement in symptoms and continues meloxicam and knee sleeve. ? ?Examination does show tenderness at the medial joint line, resolution of effusion, otherwise normal findings. His x-rays show comparable degenerative changes on bilateral knees. ? ?Given excellent progress, I have advised start of formal PT, trial transition from meloxicam to PRN Celebrex, and we will seek authorization for viscosupplementation. ?

## 2021-07-25 NOTE — Patient Instructions (Signed)
-   Referral coordinator will contact you in regards to physical therapy scheduling ?-Transition from meloxicam to Celebrex, can dose this medication twice a day as needed for knee pain (take with food) ?-Can use knee brace on an as-needed basis if beneficial ?-We will reach out once we obtain information regarding viscosupplementation (gel injection) authorization ?

## 2021-07-25 NOTE — Assessment & Plan Note (Signed)
Per recent x-rays, comparable degenerative changes noted on his minimally symptomatic knee. He will start PT to preserve symptom control. ?

## 2021-08-01 ENCOUNTER — Encounter: Payer: Self-pay | Admitting: Physical Therapy

## 2021-08-01 ENCOUNTER — Ambulatory Visit: Payer: BC Managed Care – PPO | Attending: Family Medicine | Admitting: Physical Therapy

## 2021-08-01 DIAGNOSIS — M6281 Muscle weakness (generalized): Secondary | ICD-10-CM | POA: Insufficient documentation

## 2021-08-01 DIAGNOSIS — M25562 Pain in left knee: Secondary | ICD-10-CM | POA: Insufficient documentation

## 2021-08-01 DIAGNOSIS — G8929 Other chronic pain: Secondary | ICD-10-CM | POA: Diagnosis not present

## 2021-08-01 DIAGNOSIS — M1712 Unilateral primary osteoarthritis, left knee: Secondary | ICD-10-CM | POA: Insufficient documentation

## 2021-08-01 DIAGNOSIS — M1711 Unilateral primary osteoarthritis, right knee: Secondary | ICD-10-CM | POA: Diagnosis not present

## 2021-08-01 DIAGNOSIS — M170 Bilateral primary osteoarthritis of knee: Secondary | ICD-10-CM | POA: Insufficient documentation

## 2021-08-01 DIAGNOSIS — M25561 Pain in right knee: Secondary | ICD-10-CM | POA: Insufficient documentation

## 2021-08-01 NOTE — Therapy (Signed)
?OUTPATIENT PHYSICAL THERAPY LOWER EXTREMITY EVALUATION ? ? ?Patient Name: Joel Humphrey ?MRN: [id number] ?DOB:1960/11/17, 61 y.o., male ?Today's Date: 08/02/2021 ? ? PT End of Session - 08/01/21 1456   ? ? Visit Number 1   ? Number of Visits 11   ? Date for PT Re-Evaluation 09/12/21   ? Authorization Type [employer] - 18 visits per calendar year   ? Progress Note Due on Visit 10   ? PT Start Time [phone]   ? PT Stop Time 0947   ? PT Time Calculation (min) 49 min   ? Equipment Utilized During Treatment --   pt uses open-patellar compression sleeve on L knee  ? Activity Tolerance Patient tolerated treatment well;No increased pain   ? Behavior During Therapy [facility] for tasks assessed/performed   ? ?  ?  ? ?  ? ? ?Past Medical History:  ?Diagnosis Date  ? AC (acromioclavicular) arthritis 02/17/2015  ? BPH with obstruction/lower urinary tract symptoms 12/10/2017  ? Combined fat and carbohydrate induced hyperlipemia 02/17/2015  ? Erectile dysfunction 12/10/2017  ? Plantar fasciitis 02/17/2015  ? Tendinitis of elbow or forearm 02/17/2015  ? Wears dentures   ? full upper, partial lower  ? ?Past Surgical History:  ?Procedure Laterality Date  ? COLONOSCOPY  2010  ? @ [facility]  ? COLONOSCOPY WITH PROPOFOL N/A 01/17/2018  ? Procedure: COLONOSCOPY WITH PROPOFOL WITH BIOPSIES;  Surgeon: Lucilla Lame, MD;  Location: [place];  Service: Endoscopy;  Laterality: N/A;  ? POLYPECTOMY N/A 01/17/2018  ? Procedure: POLYPECTOMY;  Surgeon: Lucilla Lame, MD;  Location: [place];  Service: Endoscopy;  Laterality: N/A;  ? ?Patient Active Problem List  ? Diagnosis Date Noted  ? Primary osteoarthritis of left knee 07/25/2021  ? Primary osteoarthritis of right knee 07/25/2021  ? Chronic pain of left knee 07/11/2021  ? Arthralgia of right knee 07/11/2021  ? Special screening for malignant neoplasms, colon   ? Rectal polyp   ? Polyp of sigmoid colon   ? Erectile dysfunction 12/10/2017  ? BPH with obstruction/lower urinary tract symptoms 12/10/2017   ? AC (acromioclavicular) arthritis 02/17/2015  ? History of colon polyps 02/17/2015  ? Combined fat and carbohydrate induced hyperlipemia 02/17/2015  ? Plantar fasciitis 02/17/2015  ? Tendinitis of elbow or forearm 02/17/2015  ? Compulsive tobacco user syndrome 02/17/2015  ? ? ?PCP: [name], MD ? ?REFERRING PROVIDER: Montel Culver, MD ? ?REFERRING DIAG:  ?M17.12 (ICD-10-CM) - Primary osteoarthritis of left knee  ?M17.11 (ICD-10-CM) - Primary osteoarthritis of right knee  ? ? ?THERAPY DIAG:  ?Chronic pain of both knees ? ?Muscle weakness (generalized) ? ?Bilateral primary osteoarthritis of knee ? ?ONSET DATE: 06/20/21 ? ?SUBJECTIVE:  ? ?SUBJECTIVE STATEMENT: ?Pt is a 61 year old male with primary complaint of L>R knee pain with Hx of bilateral knee OA. Per referring provider, medial tibiofemoral compartment involved primarily. ? ?PERTINENT HISTORY: ?Pt is a 61 year old male with primary complaint of L>R knee pain with Hx of bilateral knee OA. Per referring provider, medial tibiofemoral compartment involved primarily. Patient reports progressively worsening L knee pain more so than R knee. He states it has been bothering him about 6 months. Pt had significant joint effusion and this was aspirated when pt visited Dr. Zigmund Daniel. Pt has open-patella knee support/sleeve and uses Voltaren gel - he feels these help. Pt is using Celebrex at this time and he has just initiated this medication. Patient reports some tingling on bottom of his feet. Patient feels that prolonged work  on concrete has contributed to his current condition (pt works in [employer], quantity as Company secretary).  ? ?PAIN:  ?Are you having pain? Yes: NPRS scale: 2-3/10 presently, worst 8/10 ?Pain location: medial joint line  ?Pain description: sharp  ?Aggravating factors: prolonged standing on concrete, "quick move"/turn or pivot in standing ?Relieving factors: following aspiration, Voltaren, compressive sleeve ? ?PRECAUTIONS: None ? ?WEIGHT BEARING  RESTRICTIONS No ? ?FALLS:  ?Has patient fallen in last 6 months? No ? ?LIVING ENVIRONMENT: ?Lives with: lives with their family ?Lives in: House/apartment ?Stairs: Yes: Internal: 15-20 steps; on left going up ? ? ?OCCUPATION: Patient works in [employer], quantity as Company secretary. He is moving throughout work facility - walking throughout the day on concrete floor. Patient works with heavy machinery, he does not perform heavy manual lifting. Pt intermittently has to push cart, up to 50 lbs per his job description.  ? ?PLOF: Independent ? ?PATIENT GOALS "I want to know what I need to do to alleviate this issue" and prevent future re-occurrence of this issue  ? ? ?OBJECTIVE:  ? ?DIAGNOSTIC FINDINGS:  ?Radiographs (07/11/21) ? ?Right knee ?IMPRESSION: ?1. Moderate medial compartmental osteoarthritis. No acute bony ?abnormality. ? ?Left knee ?IMPRESSION: ?1. Degenerative changes in the medial and patellofemoral ?compartments. No acute bony abnormality. ?2. Gas in the suprapatellar joint space compatible with joint ?aspiration 2 days ago. ? ? ? ? ?PATIENT SURVEYS:  ?FOTO 77 ? ?COGNITION: ? Overall cognitive status: Within functional limits for tasks assessed   ?  ?SENSATION: ?Deferred ? ?MUSCLE LENGTH: ?Hamstrings: Right WFL; Left WFL ?Ely's: R Positive, L Positive ? ?POSTURE:  ?Forward flexed posture in standing and forward headed rounded shoulders in static sitting and standing ? ?PALPATION: ?Tenderness to palpation along L>R medial tibiofemoral joint line ? ?LE ROM: ? ?Active ROM Right ?08/02/2021 Left ?08/02/2021  ?Hip flexion    ?Hip extension    ?Hip abduction    ?Hip adduction    ?Hip internal rotation    ?Hip external rotation    ?Knee flexion 138 139  ?Knee extension +3 +5  ?Ankle dorsiflexion 18 19  ?Ankle plantarflexion    ?Ankle inversion    ?Ankle eversion    ? (Blank rows = not tested) ? ?LE MMT: ? ?MMT Right ?08/02/2021 Left ?08/02/2021  ?Hip flexion 4 4+  ?Hip extension    ?Hip abduction 4+ 4  ?Hip adduction 5 5  ?Hip  internal rotation 5 5  ?Hip external rotation 5 5  ?Knee flexion 5 5  ?Knee extension 5 5  ?Ankle dorsiflexion 5 5  ?Ankle plantarflexion 5 5  ?Ankle inversion 5 5  ?Ankle eversion 5 5  ? (Blank rows = not tested) ? ? ? ?PASSIVE ACCESSORY JOINT MOBILITY ?Patellar: decreased patellar glide in all planes, moderate restriction  ?  ? ? ?LOWER EXTREMITY SPECIAL TESTS:  ?Knee special tests: McMurray's test: negative, Varus stress negative, Valgus stress negative, Anterior drawer negative ? ? ?GAIT: ?Patient demonstrates decreased forward flexed trunk through gait cycle, mild decreased heel strike at initial contact ? ? ? ?TODAY'S TREATMENT: ? ?Therapeutic Exercise - for HEP establishment, discussion on appropriate exercise/activity modification, PT education ? ? ?Reviewed baseline home exercises and provided handout for [facility] program (see Access Code); tactile cueing and therapist demonstration utilized as needed for carryover of proper technique to HEP.   ? ?Patient education on current condition, anatomy involved, prognosis, plan of care. Discussion on activity modification to prevent flare-up of condition, including .  ? ? ? ?PATIENT EDUCATION:  ?  Education details: see above for patient education details ?Person educated: Patient ?Education method: Explanation, Demonstration, and Handouts ?Education comprehension: verbalized understanding and returned demonstration ? ? ?HOME EXERCISE PROGRAM: ?Access Code [email or web address] ? ? ?ASSESSMENT: ? ?CLINICAL IMPRESSION: ?Patient is a 61 y.o. male who was seen today for physical therapy evaluation and treatment for bilateral knee OA. Patient had significant joint effusion of L knee with recent aspiration during his visit with Dr. Zigmund Daniel on 07/11/2021; this notably improved his L knee pain. Patient does function at high level relative to his age; he does have impairments in hip flexor and abductor strength, L>R tibiofemoral joint edema, decreased L>R patellar mobility, mild gait  changes, quadriceps tightness bilaterally. 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 Pt will bene

## 2021-08-08 ENCOUNTER — Ambulatory Visit: Payer: BC Managed Care – PPO | Admitting: Physical Therapy

## 2021-08-08 ENCOUNTER — Encounter: Payer: Self-pay | Admitting: Physical Therapy

## 2021-08-08 DIAGNOSIS — G8929 Other chronic pain: Secondary | ICD-10-CM | POA: Diagnosis not present

## 2021-08-08 DIAGNOSIS — M6281 Muscle weakness (generalized): Secondary | ICD-10-CM | POA: Diagnosis not present

## 2021-08-08 DIAGNOSIS — M170 Bilateral primary osteoarthritis of knee: Secondary | ICD-10-CM | POA: Diagnosis not present

## 2021-08-08 DIAGNOSIS — M25561 Pain in right knee: Secondary | ICD-10-CM | POA: Diagnosis not present

## 2021-08-08 DIAGNOSIS — M1712 Unilateral primary osteoarthritis, left knee: Secondary | ICD-10-CM | POA: Diagnosis not present

## 2021-08-08 DIAGNOSIS — M25562 Pain in left knee: Secondary | ICD-10-CM | POA: Diagnosis not present

## 2021-08-08 DIAGNOSIS — M1711 Unilateral primary osteoarthritis, right knee: Secondary | ICD-10-CM | POA: Diagnosis not present

## 2021-08-08 NOTE — Therapy (Signed)
?OUTPATIENT PHYSICAL THERAPY TREATMENT NOTE ? ? ?Patient Name: Joel Humphrey ?MRN: [id number] ?DOB:07-Jun-1960, 61 y.o., male ?Today's Date: 08/08/2021 ? ?PCP: [name], MD ?REFERRING PROVIDER: Montel Culver, MD ? ?END OF SESSION:  ? PT End of Session - 08/08/21 0174   ? ? Visit Number 2   ? Number of Visits 11   ? Date for PT Re-Evaluation 09/12/21   ? Authorization Type BCBS - 18 visits per calendar year   ? Progress Note Due on Visit 10   ? PT Start Time [phone]   ? PT Stop Time 1030   ? PT Time Calculation (min) 44 min   ? Equipment Utilized During Treatment --   pt uses open-patellar compression sleeve on L knee  ? Activity Tolerance Patient tolerated treatment well;No increased pain   ? Behavior During Therapy [facility] for tasks assessed/performed   ? ?  ?  ? ?  ? ? ? ?Past Medical History:  ?Diagnosis Date  ? AC (acromioclavicular) arthritis 02/17/2015  ? BPH with obstruction/lower urinary tract symptoms 12/10/2017  ? Combined fat and carbohydrate induced hyperlipemia 02/17/2015  ? Erectile dysfunction 12/10/2017  ? Plantar fasciitis 02/17/2015  ? Tendinitis of elbow or forearm 02/17/2015  ? Wears dentures   ? full upper, partial lower  ? ?Past Surgical History:  ?Procedure Laterality Date  ? COLONOSCOPY  2010  ? @ [facility]  ? COLONOSCOPY WITH PROPOFOL N/A 01/17/2018  ? Procedure: COLONOSCOPY WITH PROPOFOL WITH BIOPSIES;  Surgeon: Lucilla Lame, MD;  Location: [place];  Service: Endoscopy;  Laterality: N/A;  ? POLYPECTOMY N/A 01/17/2018  ? Procedure: POLYPECTOMY;  Surgeon: Lucilla Lame, MD;  Location: [place];  Service: Endoscopy;  Laterality: N/A;  ? ?Patient Active Problem List  ? Diagnosis Date Noted  ? Primary osteoarthritis of left knee 07/25/2021  ? Primary osteoarthritis of right knee 07/25/2021  ? Chronic pain of left knee 07/11/2021  ? Arthralgia of right knee 07/11/2021  ? Special screening for malignant neoplasms, colon   ? Rectal polyp   ? Polyp of sigmoid colon   ? Erectile  dysfunction 12/10/2017  ? BPH with obstruction/lower urinary tract symptoms 12/10/2017  ? AC (acromioclavicular) arthritis 02/17/2015  ? History of colon polyps 02/17/2015  ? Combined fat and carbohydrate induced hyperlipemia 02/17/2015  ? Plantar fasciitis 02/17/2015  ? Tendinitis of elbow or forearm 02/17/2015  ? Compulsive tobacco user syndrome 02/17/2015  ? ? ?REFERRING DIAG: M17.12 (ICD-10-CM) - Primary osteoarthritis of left knee ?M17.11 (ICD-10-CM) - Primary osteoarthritis of right knee ? ?THERAPY DIAG:  ?Chronic pain of both knees ? ?Muscle weakness (generalized) ? ?Bilateral primary osteoarthritis of knee ? ?PERTINENT HISTORY: Pt is a 61 year old male with primary complaint of L>R knee pain with Hx of bilateral knee OA. Per referring provider, medial tibiofemoral compartment involved primarily. Patient reports progressively worsening L knee pain more so than R knee. He states it has been bothering him about 6 months. Pt had significant joint effusion and this was aspirated when pt visited Dr. Zigmund Daniel. Pt has open-patella knee support/sleeve and uses Voltaren gel - he feels these help. Pt is using Celebrex at this time and he has just initiated this medication. Patient reports some tingling on bottom of his feet. Patient feels that prolonged work on concrete has contributed to his current condition (pt works in [employer], quantity as Company secretary).   ? ?PRECAUTIONS: None ? ?SUBJECTIVE: Patient reports not having notable pain at arrival. He states he feels more pain on  the weekends when he is lying down and more static versus on the move. Patient reports doing well with initial trials of HEP, but he hasn't completed them every day due to working overtime/busy schedule.  ? ? ?PAIN:  ?Are you having pain? No ? ? ? ?TODAY'S TREATMENT: ?  ? ?Manual Therapy - for symptom modulation, soft tissue sensitivity and mobility, joint mobility as needed for pain-free knee complex ROM ? ?STM and IASTM with Theraband roller,  bilateral quadriceps ?Bilateral patellar glides, gr III in all planes for patellofemoral joint mobility ? ? ? ? ?Therapeutic Exercise - for improved soft tissue flexibility and extensibility as needed for ROM, improved strength as needed to improve performance of CKC activities/functional movements ? ?NuStep; Level 3; 5 minutes, seat at 9, arms at 9 - subjective information and HEP discussed during this time  ?Prone Quad stretch; 2x30 seconds, bilateral  ?Bridge with Tband for hip abduction moment; Black Tband; 2x10 ? ?For HEP review:  ?Straight leg raise; 2x10 ?Sidelying hip abduction; 2x10 ? ? ? ? ?  ?PATIENT EDUCATION:  ?Education details: see above for patient education details ?Person educated: Patient ?Education method: Explanation, Demonstration, and Handouts ?Education comprehension: verbalized understanding and returned demonstration ?  ?  ?HOME EXERCISE PROGRAM: ?Access Code [email or web address] ?  ? ? ? ? ?  ?OBJECTIVE:  ?  ?DIAGNOSTIC FINDINGS:  ?Radiographs (07/11/21) ?  ?Right knee ?IMPRESSION: ?1. Moderate medial compartmental osteoarthritis. No acute bony ?abnormality. ?  ?Left knee ?IMPRESSION: ?1. Degenerative changes in the medial and patellofemoral ?compartments. No acute bony abnormality. ?2. Gas in the suprapatellar joint space compatible with joint ?aspiration 2 days ago. ?  ?  ?  ?  ?PATIENT SURVEYS:  ?FOTO 60 ?  ?MUSCLE LENGTH: ?Hamstrings: Right WFL; Left WFL ?Ely's: R Positive, L Positive ?  ?POSTURE:  ?Forward flexed posture in standing and forward headed rounded shoulders in static sitting and standing ?  ?PALPATION: ?Tenderness to palpation along L>R medial tibiofemoral joint line ?  ?LE ROM: ?  ?Active ROM Right ?08/02/2021 Left ?08/02/2021  ?Hip flexion      ?Hip extension      ?Hip abduction      ?Hip adduction      ?Hip internal rotation      ?Hip external rotation      ?Knee flexion 138 139  ?Knee extension +3 +5  ?Ankle dorsiflexion 18 19  ?Ankle plantarflexion      ?Ankle inversion      ?Ankle  eversion      ? (Blank rows = not tested) ?  ?LE MMT: ?  ?MMT Right ?08/02/2021 Left ?08/02/2021  ?Hip flexion 4 4+  ?Hip extension      ?Hip abduction 4+ 4  ?Hip adduction 5 5  ?Hip internal rotation 5 5  ?Hip external rotation 5 5  ?Knee flexion 5 5  ?Knee extension 5 5  ?Ankle dorsiflexion 5 5  ?Ankle plantarflexion 5 5  ?Ankle inversion 5 5  ?Ankle eversion 5 5  ? (Blank rows = not tested) ?  ?  ?  ?PASSIVE ACCESSORY JOINT MOBILITY ?Patellar: decreased patellar glide in all planes, moderate restriction   ?            ?  ?  ?LOWER EXTREMITY SPECIAL TESTS:  ?Knee special tests: McMurray's test: negative, Varus stress negative, Valgus stress negative, Anterior drawer negative ?  ?  ?GAIT: ?Patient demonstrates decreased forward flexed trunk through gait cycle, mild decreased heel strike at initial contact ?  ?  ?  ?  ?  ASSESSMENT: ?  ?CLINICAL IMPRESSION: ?Patient demonstrates minimal R knee patellar glide deficits today; pt has limited superior>inferior patellar glide on L; no gross knee complex ROM loss. He is significantly challenged with hip abductor isotonics. He has not been completely consistent with HEP and will benefit from regular work on quad/hip strength and specific stretching. Patient does have notable muscle fatigue following low-impact (largely OKC) drills performed today, but he tolerates additional exercise well.  Patient has remaining deficits in hip flexor and abductor strength, L>R tibiofemoral joint edema, decreased L>R patellar mobility, mild gait changes, quadriceps tightness bilaterally. 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 Pt will benefit from skilled PT services to address the above deficits and improve function and QoL.  ?  ?  ?  ?OBJECTIVE IMPAIRMENTS Abnormal gait, decreased strength, hypomobility, increased edema, impaired flexibility, and pain.  ?  ?ACTIVITY LIMITATIONS  community activity and occupation.  ?  ?PERSONAL FACTORS Past/current experiences, Profession, Time since onset of injury/illness/exacerbation, and Transportation are also affecting patient's functional outcome.  ?

## 2021-08-09 ENCOUNTER — Ambulatory Visit: Payer: BC Managed Care – PPO | Admitting: Physical Therapy

## 2021-08-09 NOTE — Patient Instructions (Incomplete)
?REFERRING DIAG: M17.12 (ICD-10-CM) - Primary osteoarthritis of left knee ?M17.11 (ICD-10-CM) - Primary osteoarthritis of right knee ?  ?THERAPY DIAG:  ?Chronic pain of both knees ?  ?Muscle weakness (generalized) ?  ?Bilateral primary osteoarthritis of knee ?  ?PERTINENT HISTORY: Pt is a 61 year old male with primary complaint of L>R knee pain with Hx of bilateral knee OA. Per referring provider, medial tibiofemoral compartment involved primarily. Patient reports progressively worsening L knee pain more so than R knee. He states it has been bothering him about 6 months. Pt had significant joint effusion and this was aspirated when pt visited Dr. Zigmund Daniel. Pt has open-patella knee support/sleeve and uses Voltaren gel - he feels these help. Pt is using Celebrex at this time and he has just initiated this medication. Patient reports some tingling on bottom of his feet. Patient feels that prolonged work on concrete has contributed to his current condition (pt works in [employer], quantity as Company secretary).   ?  ?PRECAUTIONS: None ?  ?SUBJECTIVE: Patient reports not having notable pain at arrival. He states he feels more pain on the weekends when he is lying down and more static versus on the move. Patient reports doing well with initial trials of HEP, but he hasn't completed them every day due to working overtime/busy schedule.  ?  ?  ?PAIN:  ?Are you having pain? No ?  ?  ?  ?TODAY'S TREATMENT: ?  ?  ?Manual Therapy - for symptom modulation, soft tissue sensitivity and mobility, joint mobility as needed for pain-free knee complex ROM ?  ?STM and IASTM with Theraband roller, bilateral quadriceps ?Bilateral patellar glides, gr III in all planes for patellofemoral joint mobility ?  ?  ?  ?  ?Therapeutic Exercise - for improved soft tissue flexibility and extensibility as needed for ROM, improved strength as needed to improve performance of CKC activities/functional movements ?  ?NuStep; Level 3; 5 minutes, seat at 9, arms at 9 -  subjective information and HEP discussed during this time  ?Prone Quad stretch; 2x30 seconds, bilateral  ?Bridge with Tband for hip abduction moment; Black Tband; 2x10 ?  ?For HEP review:  ?Straight leg raise; 2x10 ?Sidelying hip abduction; 2x10 ?  ?  ?  ?  ?  ?PATIENT EDUCATION:  ?Education details: see above for patient education details ?Person educated: Patient ?Education method: Explanation, Demonstration, and Handouts ?Education comprehension: verbalized understanding and returned demonstration ?  ?  ?HOME EXERCISE PROGRAM: ?Access Code [email or web address] ?  ?  ?  ?  ?  ?  ?OBJECTIVE:  ?  ?DIAGNOSTIC FINDINGS:  ?Radiographs (07/11/21) ?  ?Right knee ?IMPRESSION: ?1. Moderate medial compartmental osteoarthritis. No acute bony ?abnormality. ?  ?Left knee ?IMPRESSION: ?1. Degenerative changes in the medial and patellofemoral ?compartments. No acute bony abnormality. ?2. Gas in the suprapatellar joint space compatible with joint ?aspiration 2 days ago. ?  ?  ?  ?  ?PATIENT SURVEYS:  ?FOTO 9 ?  ?MUSCLE LENGTH: ?Hamstrings: Right WFL; Left WFL ?Ely's: R Positive, L Positive ?  ?POSTURE:  ?Forward flexed posture in standing and forward headed rounded shoulders in static sitting and standing ?  ?PALPATION: ?Tenderness to palpation along L>R medial tibiofemoral joint line ?  ?LE ROM: ?  ?Active ROM Right ?08/02/2021 Left ?08/02/2021  ?Hip flexion      ?Hip extension      ?Hip abduction      ?Hip adduction      ?Hip internal rotation      ?Hip external rotation      ?  Knee flexion 138 139  ?Knee extension +3 +5  ?Ankle dorsiflexion 18 19  ?Ankle plantarflexion      ?Ankle inversion      ?Ankle eversion      ? (Blank rows = not tested) ?  ?LE MMT: ?  ?MMT Right ?08/02/2021 Left ?08/02/2021  ?Hip flexion 4 4+  ?Hip extension      ?Hip abduction 4+ 4  ?Hip adduction 5 5  ?Hip internal rotation 5 5  ?Hip external rotation 5 5  ?Knee flexion 5 5  ?Knee extension 5 5  ?Ankle dorsiflexion 5 5  ?Ankle plantarflexion 5 5  ?Ankle inversion 5 5   ?Ankle eversion 5 5  ? (Blank rows = not tested) ?  ?  ?  ?PASSIVE ACCESSORY JOINT MOBILITY ?Patellar: decreased patellar glide in all planes, moderate restriction   ?            ?  ?  ?LOWER EXTREMITY SPECIAL TESTS:  ?Knee special tests: McMurray's test: negative, Varus stress negative, Valgus stress negative, Anterior drawer negative ?  ?  ?GAIT: ?Patient demonstrates decreased forward flexed trunk through gait cycle, mild decreased heel strike at initial contact ?  ?  ?  ?  ?ASSESSMENT: ?  ?CLINICAL IMPRESSION: ?Patient demonstrates minimal R knee patellar glide deficits today; pt has limited superior>inferior patellar glide on L; no gross knee complex ROM loss. He is significantly challenged with hip abductor isotonics. He has not been completely consistent with HEP and will benefit from regular work on quad/hip strength and specific stretching. Patient does have notable muscle fatigue following low-impact (largely OKC) drills performed today, but he tolerates additional exercise well.  Patient has remaining deficits in hip flexor and abductor strength, L>R tibiofemoral joint edema, decreased L>R patellar mobility, mild gait changes, quadriceps tightness bilaterally. 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 Pt will benefit from skilled PT services to address the above deficits and improve function and QoL.  ?  ?  ?  ?OBJECTIVE IMPAIRMENTS Abnormal gait, decreased strength, hypomobility, increased edema, impaired flexibility, and pain.  ?  ?ACTIVITY LIMITATIONS community activity and occupation.  ?  ?PERSONAL FACTORS Past/current experiences, Profession, Time since onset of injury/illness/exacerbation, and Transportation are also affecting patient's functional outcome.  ?  ?  ?  ?  ?GOALS: ?  ?  ?SHORT TERM GOALS: Target date: 08/23/2021 ?  ?Patient will be independent and 100% compliant with HEP as  needed to improve mobility deficits and strength as needed for improved function and ability to complete prolonged standing work duties ?Baseline: 08/01/21: Baseline HEP initiated ?Goal status: INITIAL ?  ?  ?LONG TERM GOALS: Target date: 09/13/2021 ?  ?Patient will demonstrate improved function as evidenced by a score of 79 on FOTO measure for full participation in activities at home and in the community. ?  ?Baseline: 08/01/21: 59 ?Goal status: INITIAL ?  ?2.  Patient will have NPRS no greater than 2/10 at worst with work duties, household tasks, self-care, and community-level ambulation ?Baseline: 08/01/21: Pain 8/10 at worst ?Goal status: INITIAL ?  ?3.  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  ?4.  Patient will perform 3-way mini-lunge (anterior, anterolateral, and lateral) with sound technique and no  reproduction of pain requiring pivoting over stance LE, indicative of improved ability to perform pivoting during standing work duties ?Baseline: 08/01/21 ?Goal status: INITIAL ?  ?  ?  ?  ?PLAN: ?PT FREQUENCY: 2x/week, with anticipated tapered visits after 3-4 weeks of PT ?  ?PT DURATION: 6 weeks ?  ?PLANNED INTERVENTIONS: Therapeutic exercises, Therapeutic activity, Neuromuscular re-education, Balance training, Gait training, Patient/Family education, and Joint mobilization ?  ?PLAN FOR NEXT SESSION: Patellar mobilization, manual therapy for quadriceps flexibility/extensibility, strategies for edema control prn. Continue with hip strengthening with focus on open chain; gradual progression to closed-chain hip strengthening.  ?  ?  ?  ? ?

## 2021-08-10 ENCOUNTER — Encounter: Payer: BC Managed Care – PPO | Admitting: Physical Therapy

## 2021-08-15 ENCOUNTER — Encounter: Payer: BC Managed Care – PPO | Admitting: Physical Therapy

## 2021-08-15 ENCOUNTER — Ambulatory Visit: Payer: BC Managed Care – PPO | Admitting: Physical Therapy

## 2021-08-15 ENCOUNTER — Encounter: Payer: Self-pay | Admitting: Physical Therapy

## 2021-08-15 DIAGNOSIS — M6281 Muscle weakness (generalized): Secondary | ICD-10-CM

## 2021-08-15 DIAGNOSIS — M1712 Unilateral primary osteoarthritis, left knee: Secondary | ICD-10-CM | POA: Diagnosis not present

## 2021-08-15 DIAGNOSIS — M25562 Pain in left knee: Secondary | ICD-10-CM | POA: Diagnosis not present

## 2021-08-15 DIAGNOSIS — G8929 Other chronic pain: Secondary | ICD-10-CM | POA: Diagnosis not present

## 2021-08-15 DIAGNOSIS — M170 Bilateral primary osteoarthritis of knee: Secondary | ICD-10-CM

## 2021-08-15 DIAGNOSIS — M1711 Unilateral primary osteoarthritis, right knee: Secondary | ICD-10-CM | POA: Diagnosis not present

## 2021-08-15 DIAGNOSIS — M25561 Pain in right knee: Secondary | ICD-10-CM | POA: Diagnosis not present

## 2021-08-15 NOTE — Therapy (Signed)
?OUTPATIENT PHYSICAL THERAPY TREATMENT NOTE ? ? ?Patient Name: Joel Humphrey ?MRN: [id number] ?DOB:October 18, 1960, 61 y.o., male ?Today's Date: 08/15/2021 ? ?PCP: [name], MD ?REFERRING PROVIDER: Montel Culver, MD ? ?END OF SESSION:  ? PT End of Session - 08/15/21 0909   ? ? Visit Number 3   ? Number of Visits 11   ? Date for PT Re-Evaluation 09/12/21   ? Authorization Type [employer] - 18 visits per calendar year   ? Progress Note Due on Visit 10   ? PT Start Time [phone]   ? PT Stop Time 0947   ? PT Time Calculation (min) 42 min   ? Equipment Utilized During Treatment --   pt uses open-patellar compression sleeve on L knee  ? Activity Tolerance Patient tolerated treatment well;No increased pain   ? Behavior During Therapy [facility] for tasks assessed/performed   ? ?  ?  ? ?  ? ? ?Past Medical History:  ?Diagnosis Date  ? AC (acromioclavicular) arthritis 02/17/2015  ? BPH with obstruction/lower urinary tract symptoms 12/10/2017  ? Combined fat and carbohydrate induced hyperlipemia 02/17/2015  ? Erectile dysfunction 12/10/2017  ? Plantar fasciitis 02/17/2015  ? Tendinitis of elbow or forearm 02/17/2015  ? Wears dentures   ? full upper, partial lower  ? ?Past Surgical History:  ?Procedure Laterality Date  ? COLONOSCOPY  2010  ? @ [facility]  ? COLONOSCOPY WITH PROPOFOL N/A 01/17/2018  ? Procedure: COLONOSCOPY WITH PROPOFOL WITH BIOPSIES;  Surgeon: Lucilla Lame, MD;  Location: [place];  Service: Endoscopy;  Laterality: N/A;  ? POLYPECTOMY N/A 01/17/2018  ? Procedure: POLYPECTOMY;  Surgeon: Lucilla Lame, MD;  Location: [place];  Service: Endoscopy;  Laterality: N/A;  ? ?Patient Active Problem List  ? Diagnosis Date Noted  ? Primary osteoarthritis of left knee 07/25/2021  ? Primary osteoarthritis of right knee 07/25/2021  ? Chronic pain of left knee 07/11/2021  ? Arthralgia of right knee 07/11/2021  ? Special screening for malignant neoplasms, colon   ? Rectal polyp   ? Polyp of sigmoid colon   ? Erectile  dysfunction 12/10/2017  ? BPH with obstruction/lower urinary tract symptoms 12/10/2017  ? AC (acromioclavicular) arthritis 02/17/2015  ? History of colon polyps 02/17/2015  ? Combined fat and carbohydrate induced hyperlipemia 02/17/2015  ? Plantar fasciitis 02/17/2015  ? Tendinitis of elbow or forearm 02/17/2015  ? Compulsive tobacco user syndrome 02/17/2015  ? ?  ?  ?REFERRING DIAG: M17.12 (ICD-10-CM) - Primary osteoarthritis of left knee ?M17.11 (ICD-10-CM) - Primary osteoarthritis of right knee ?  ?THERAPY DIAG:  ?Chronic pain of both knees ?  ?Muscle weakness (generalized) ?  ?Bilateral primary osteoarthritis of knee ?  ?PERTINENT HISTORY: Pt is a 61 year old male with primary complaint of L>R knee pain with Hx of bilateral knee OA. Per referring provider, medial tibiofemoral compartment involved primarily. Patient reports progressively worsening L knee pain more so than R knee. He states it has been bothering him about 6 months. Pt had significant joint effusion and this was aspirated when pt visited Dr. Zigmund Daniel. Pt has open-patella knee support/sleeve and uses Voltaren gel - he feels these help. Pt is using Celebrex at this time and he has just initiated this medication. Patient reports some tingling on bottom of his feet. Patient feels that prolonged work on concrete has contributed to his current condition (pt works in [employer], quantity as Company secretary).   ?  ?PRECAUTIONS: None ?  ?SUBJECTIVE: Patient reports having mild continuous pain at  arrival to PT. Pt reports medial joint line pain primarily. He reports sharp pain with sudden movements/turns. He reports compliance with his HEP.  ?  ?  ?PAIN:  ?Are you having pain? Yes, 3/10 at arrival to PT ?  ?  ?  ?TODAY'S TREATMENT: ?  ?  ?Manual Therapy - for symptom modulation, soft tissue sensitivity and mobility, joint mobility as needed for pain-free knee complex ROM ?  ?STM and IASTM with Theraband roller, bilateral quadriceps ?Bilateral patellar glides, gr III in  all planes for patellofemoral joint mobility ?Tibiofemoral glides anterior/posterior gr I-II for pain control in hooklying, L knee only today ?Manual prone quadriceps stretch; x 1 minute bilaterally ?  ?  ?Therapeutic Exercise - for improved soft tissue flexibility and extensibility as needed for ROM, improved strength as needed to improve performance of CKC activities/functional movements ?  ?NuStep; Level 3; 5 minutes, seat at 9, arms at 9 - subjective information discussed during this time  ? Straight leg raise; 3-lb ankle weights; 2x10 bilaterally ? Sidelying hip abduction; 2-lb ankle weights; 2x10 bilaterally ?  Bridge with Tband for hip abduction moment; Black Tband; 2x10 ?  Total Gym squat with depth to tolerance; 20x, Level # 22 ? ?  ? *not today* ?Prone Quad stretch; 2x30 seconds, bilateral  ?  ?  ?  ?PATIENT EDUCATION:  ?Education details: see above for patient education details ?Person educated: Patient ?Education method: Explanation, Demonstration, and Handouts ?Education comprehension: verbalized understanding and returned demonstration ?  ?  ?HOME EXERCISE PROGRAM: ?Access Code [email or web address] ?  ?  ?  ?  ?  ?  ?OBJECTIVE:  ?  ?DIAGNOSTIC FINDINGS:  ?Radiographs (07/11/21) ?  ?Right knee ?IMPRESSION: ?1. Moderate medial compartmental osteoarthritis. No acute bony ?abnormality. ?  ?Left knee ?IMPRESSION: ?1. Degenerative changes in the medial and patellofemoral ?compartments. No acute bony abnormality. ?2. Gas in the suprapatellar joint space compatible with joint ?aspiration 2 days ago. ?  ?  ?  ?  ?PATIENT SURVEYS:  ?FOTO 40 ?  ?MUSCLE LENGTH: ?Hamstrings: Right WFL; Left WFL ?Ely's: R Positive, L Positive ?  ?POSTURE:  ?Forward flexed posture in standing and forward headed rounded shoulders in static sitting and standing ?  ?PALPATION: ?Tenderness to palpation along L>R medial tibiofemoral joint line ?  ?LE ROM: ?  ?Active ROM Right ?08/02/2021 Left ?08/02/2021  ?Hip flexion      ?Hip extension      ?Hip  abduction      ?Hip adduction      ?Hip internal rotation      ?Hip external rotation      ?Knee flexion 138 139  ?Knee extension +3 +5  ?Ankle dorsiflexion 18 19  ?Ankle plantarflexion      ?Ankle inversion      ?Ankle eversion      ? (Blank rows = not tested) ?  ?LE MMT: ?  ?MMT Right ?08/02/2021 Left ?08/02/2021  ?Hip flexion 4 4+  ?Hip extension      ?Hip abduction 4+ 4  ?Hip adduction 5 5  ?Hip internal rotation 5 5  ?Hip external rotation 5 5  ?Knee flexion 5 5  ?Knee extension 5 5  ?Ankle dorsiflexion 5 5  ?Ankle plantarflexion 5 5  ?Ankle inversion 5 5  ?Ankle eversion 5 5  ? (Blank rows = not tested) ?  ?  ?  ?PASSIVE ACCESSORY JOINT MOBILITY ?Patellar: decreased patellar glide in all planes, moderate restriction   ?            ?  ?  ?  LOWER EXTREMITY SPECIAL TESTS:  ?Knee special tests: McMurray's test: negative, Varus stress negative, Valgus stress negative, Anterior drawer negative ?  ?  ?GAIT: ?Patient demonstrates decreased forward flexed trunk through gait cycle, mild decreased heel strike at initial contact ?  ?  ?  ?  ?ASSESSMENT: ?  ?CLINICAL IMPRESSION: ?Patient has continuous mild to moderate-intensity anterior knee pain with intermittent higher-level sharp pain with abrupt movement in closed-chain or pivoting over affected LE. Patient tolerates progression in intensity of exercises well with primarily feeling muscular fatigue without notable medial knee pain with drills performed. He is able to initiate closed-chain loading in position that partially eliminates body weight with squat to 90 deg - no significant increase in symptoms with this progression. Patient has remaining deficits in hip flexor and abductor strength, L>R tibiofemoral joint edema, decreased L>R patellar mobility, mild gait changes, quadriceps tightness bilaterally. 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 Pt will  benefit from skilled PT services to address the above deficits and improve function and QoL.  ?  ?  ?  ?OBJECTIVE IMPAIRMENTS Abnormal gait, decreased strength, hypomobility, increased edema, impaired flexibility, and p

## 2021-08-17 ENCOUNTER — Ambulatory Visit: Payer: BC Managed Care – PPO | Admitting: Physical Therapy

## 2021-08-17 ENCOUNTER — Encounter: Payer: BC Managed Care – PPO | Admitting: Physical Therapy

## 2021-08-17 ENCOUNTER — Encounter: Payer: Self-pay | Admitting: Physical Therapy

## 2021-08-17 DIAGNOSIS — M170 Bilateral primary osteoarthritis of knee: Secondary | ICD-10-CM | POA: Diagnosis not present

## 2021-08-17 DIAGNOSIS — M25562 Pain in left knee: Secondary | ICD-10-CM | POA: Diagnosis not present

## 2021-08-17 DIAGNOSIS — M1712 Unilateral primary osteoarthritis, left knee: Secondary | ICD-10-CM | POA: Diagnosis not present

## 2021-08-17 DIAGNOSIS — M1711 Unilateral primary osteoarthritis, right knee: Secondary | ICD-10-CM | POA: Diagnosis not present

## 2021-08-17 DIAGNOSIS — M25561 Pain in right knee: Secondary | ICD-10-CM | POA: Diagnosis not present

## 2021-08-17 DIAGNOSIS — G8929 Other chronic pain: Secondary | ICD-10-CM | POA: Diagnosis not present

## 2021-08-17 DIAGNOSIS — M6281 Muscle weakness (generalized): Secondary | ICD-10-CM | POA: Diagnosis not present

## 2021-08-17 NOTE — Therapy (Signed)
?OUTPATIENT PHYSICAL THERAPY TREATMENT NOTE ? ? ?Patient Name: Joel Humphrey ?MRN: [id number] ?DOB:Sep 15, 1960, 61 y.o., male ?Today's Date: 08/17/2021 ? ?PCP: [name], MD ?REFERRING PROVIDER: Montel Culver, MD ? ?END OF SESSION:  ? PT End of Session - 08/17/21 1019   ? ? Visit Number 4   ? Number of Visits 11   ? Date for PT Re-Evaluation 09/12/21   ? Authorization Type [employer] - 18 visits per calendar year   ? Progress Note Due on Visit 10   ? PT Start Time 1025   ? PT Stop Time 1108   ? PT Time Calculation (min) 43 min   ? Equipment Utilized During Treatment --   pt uses open-patellar compression sleeve on bilateral knees  ? Activity Tolerance Patient tolerated treatment well;No increased pain   ? Behavior During Therapy [facility] for tasks assessed/performed   ? ?  ?  ? ?  ? ? ?Past Medical History:  ?Diagnosis Date  ? AC (acromioclavicular) arthritis 02/17/2015  ? BPH with obstruction/lower urinary tract symptoms 12/10/2017  ? Combined fat and carbohydrate induced hyperlipemia 02/17/2015  ? Erectile dysfunction 12/10/2017  ? Plantar fasciitis 02/17/2015  ? Tendinitis of elbow or forearm 02/17/2015  ? Wears dentures   ? full upper, partial lower  ? ?Past Surgical History:  ?Procedure Laterality Date  ? COLONOSCOPY  2010  ? @ [facility]  ? COLONOSCOPY WITH PROPOFOL N/A 01/17/2018  ? Procedure: COLONOSCOPY WITH PROPOFOL WITH BIOPSIES;  Surgeon: Lucilla Lame, MD;  Location: [place];  Service: Endoscopy;  Laterality: N/A;  ? POLYPECTOMY N/A 01/17/2018  ? Procedure: POLYPECTOMY;  Surgeon: Lucilla Lame, MD;  Location: [place];  Service: Endoscopy;  Laterality: N/A;  ? ?Patient Active Problem List  ? Diagnosis Date Noted  ? Primary osteoarthritis of left knee 07/25/2021  ? Primary osteoarthritis of right knee 07/25/2021  ? Chronic pain of left knee 07/11/2021  ? Arthralgia of right knee 07/11/2021  ? Special screening for malignant neoplasms, colon   ? Rectal polyp   ? Polyp of sigmoid colon   ? Erectile  dysfunction 12/10/2017  ? BPH with obstruction/lower urinary tract symptoms 12/10/2017  ? AC (acromioclavicular) arthritis 02/17/2015  ? History of colon polyps 02/17/2015  ? Combined fat and carbohydrate induced hyperlipemia 02/17/2015  ? Plantar fasciitis 02/17/2015  ? Tendinitis of elbow or forearm 02/17/2015  ? Compulsive tobacco user syndrome 02/17/2015  ? ? ?REFERRING DIAG: M17.12 (ICD-10-CM) - Primary osteoarthritis of left knee ?M17.11 (ICD-10-CM) - Primary osteoarthritis of right knee ?  ?THERAPY DIAG:  ?Chronic pain of both knees ?  ?Muscle weakness (generalized) ?  ?Bilateral primary osteoarthritis of knee ?  ?PERTINENT HISTORY: Pt is a 61 year old male with primary complaint of L>R knee pain with Hx of bilateral knee OA. Per referring provider, medial tibiofemoral compartment involved primarily. Patient reports progressively worsening L knee pain more so than R knee. He states it has been bothering him about 6 months. Pt had significant joint effusion and this was aspirated when pt visited Dr. Zigmund Daniel. Pt has open-patella knee support/sleeve and uses Voltaren gel - he feels these help. Pt is using Celebrex at this time and he has just initiated this medication. Patient reports some tingling on bottom of his feet. Patient feels that prolonged work on concrete has contributed to his current condition (pt works in [employer], quantity as Company secretary).   ?  ?PRECAUTIONS: None ?  ?SUBJECTIVE: Patient reports having significant stress and exhaustion this week given  workload and his aunt passing away. He does not report significant pain at arrival to PT. He arrives wearing bilateral knee braces.  ?  ?  ?PAIN:  ?Are you having pain? "I don't know" per patient. Significant pain not reported  ?  ?  ?  ?TODAY'S TREATMENT: ?  ?  ?Manual Therapy - for symptom modulation, soft tissue sensitivity and mobility, joint mobility as needed for pain-free knee complex ROM ?  ?STM and IASTM with Theraband roller, bilateral  quadriceps ?Bilateral patellar glides, gr III in all planes for patellofemoral joint mobility ?Manual prone quadriceps stretch; x 1 minute bilaterally ? ?*not today* ?Tibiofemoral glides anterior/posterior gr I-II for pain control in hooklying, L knee only today ?  ?  ?Therapeutic Exercise - for improved soft tissue flexibility and extensibility as needed for ROM, improved strength as needed to improve performance of CKC activities/functional movements ?  ?NuStep; Level 3; 5 minutes, seat at 9, arms at 9 - subjective information discussed during this time  ?Bridge with Tband for hip abduction moment; Black Tband; 2x10 ?Total Gym squat with depth to tolerance; 20x, Level # 22 ?Total Gym single-limb minisquat; 2x10, Level # 22, bilateral ?  Sit to stand from raised table; 1x15 ?  ?  ? *not today* ?Straight leg raise; 3-lb ankle weights; 2x10 bilaterally ?            Sidelying hip abduction; 2-lb ankle weights; 2x10 bilaterally ?Prone Quad stretch; 2x30 seconds, bilateral  ?  ?  ?  ?PATIENT EDUCATION:  ?Education details: see above for patient education details ?Person educated: Patient ?Education method: Explanation, Demonstration, and Handouts ?Education comprehension: verbalized understanding and returned demonstration ?  ?  ?HOME EXERCISE PROGRAM: ?Access Code [email or web address] ?  ?  ?  ?  ?  ?  ?OBJECTIVE:  ?  ?DIAGNOSTIC FINDINGS:  ?Radiographs (07/11/21) ?  ?Right knee ?IMPRESSION: ?1. Moderate medial compartmental osteoarthritis. No acute bony ?abnormality. ?  ?Left knee ?IMPRESSION: ?1. Degenerative changes in the medial and patellofemoral ?compartments. No acute bony abnormality. ?2. Gas in the suprapatellar joint space compatible with joint ?aspiration 2 days ago. ?  ?  ?  ?  ?PATIENT SURVEYS:  ?FOTO 32 ?  ?MUSCLE LENGTH: ?Hamstrings: Right WFL; Left WFL ?Ely's: R Positive, L Positive ?  ?POSTURE:  ?Forward flexed posture in standing and forward headed rounded shoulders in static sitting and standing ?   ?PALPATION: ?Tenderness to palpation along L>R medial tibiofemoral joint line ?  ?LE ROM: ?  ?Active ROM Right ?08/02/2021 Left ?08/02/2021  ?Hip flexion      ?Hip extension      ?Hip abduction      ?Hip adduction      ?Hip internal rotation      ?Hip external rotation      ?Knee flexion 138 139  ?Knee extension +3 +5  ?Ankle dorsiflexion 18 19  ?Ankle plantarflexion      ?Ankle inversion      ?Ankle eversion      ? (Blank rows = not tested) ?  ?LE MMT: ?  ?MMT Right ?08/02/2021 Left ?08/02/2021  ?Hip flexion 4 4+  ?Hip extension      ?Hip abduction 4+ 4  ?Hip adduction 5 5  ?Hip internal rotation 5 5  ?Hip external rotation 5 5  ?Knee flexion 5 5  ?Knee extension 5 5  ?Ankle dorsiflexion 5 5  ?Ankle plantarflexion 5 5  ?Ankle inversion 5 5  ?Ankle eversion 5 5  ? (Blank  rows = not tested) ?  ?  ?  ?PASSIVE ACCESSORY JOINT MOBILITY ?Patellar: decreased patellar glide in all planes, moderate restriction   ?            ?  ?  ?LOWER EXTREMITY SPECIAL TESTS:  ?Knee special tests: McMurray's test: negative, Varus stress negative, Valgus stress negative, Anterior drawer negative ?  ?  ?GAIT: ?Patient demonstrates decreased forward flexed trunk through gait cycle, mild decreased heel strike at initial contact ?  ?  ?  ?  ?ASSESSMENT: ?  ?CLINICAL IMPRESSION: ?Patient tolerates progression of exercises fairly well, but he does have fleeting anterior suprapatellar pain with single-limb lowering > 45 deg; verbal cues needed to limit ROM for single-limb squatting on [employer] today with re-assurance that pt would still get benefit of exercise in modified pain-free Rom. Patient has improving patellar mobility and demonstrates no major ROM loss; he does have ongoing positive Ely's bilaterally, though quad extensibility has improved. Patient has remaining deficits in hip flexor and abductor strength, L>R tibiofemoral joint edema, decreased L>R patellar mobility, mild gait changes, quadriceps tightness bilaterally. He has associated  activity limitations with prolonged standing on concrete/factory floor and rapid change in position/pivoting over his lower extremities. Pt will benefit from skilled PT services to address the above deficits and improve function

## 2021-08-22 ENCOUNTER — Encounter: Payer: BC Managed Care – PPO | Admitting: Physical Therapy

## 2021-08-22 ENCOUNTER — Encounter: Payer: Self-pay | Admitting: Physical Therapy

## 2021-08-22 ENCOUNTER — Ambulatory Visit: Payer: BC Managed Care – PPO | Attending: Family Medicine | Admitting: Physical Therapy

## 2021-08-22 DIAGNOSIS — M25561 Pain in right knee: Secondary | ICD-10-CM | POA: Insufficient documentation

## 2021-08-22 DIAGNOSIS — M25562 Pain in left knee: Secondary | ICD-10-CM | POA: Insufficient documentation

## 2021-08-22 DIAGNOSIS — M170 Bilateral primary osteoarthritis of knee: Secondary | ICD-10-CM | POA: Diagnosis not present

## 2021-08-22 DIAGNOSIS — M6281 Muscle weakness (generalized): Secondary | ICD-10-CM | POA: Diagnosis not present

## 2021-08-22 DIAGNOSIS — G8929 Other chronic pain: Secondary | ICD-10-CM | POA: Insufficient documentation

## 2021-08-22 NOTE — Therapy (Signed)
?OUTPATIENT PHYSICAL THERAPY TREATMENT NOTE ? ? ?Patient Name: Joel Humphrey ?MRN: [id number] ?DOB:[birth year], 61 y.o., male ?Today's Date: 08/22/2021 ? ?PCP: [name], MD ?REFERRING PROVIDER: Montel Culver, MD ? ?END OF SESSION:  ? PT End of Session - 08/22/21 0909   ? ? Visit Number 5   ? Number of Visits 11   ? Date for PT Re-Evaluation 09/12/21   ? Authorization Type [employer] - 18 visits per calendar year   ? Progress Note Due on Visit 10   ? PT Start Time [phone]   ? PT Stop Time 8101   ? PT Time Calculation (min) 38 min   ? Equipment Utilized During Treatment --   pt uses open-patellar compression sleeve on bilateral knees  ? Activity Tolerance Patient tolerated treatment well;No increased pain   ? Behavior During Therapy [facility] for tasks assessed/performed   ? ?  ?  ? ?  ? ? ?Past Medical History:  ?Diagnosis Date  ? AC (acromioclavicular) arthritis 02/17/2015  ? BPH with obstruction/lower urinary tract symptoms 12/10/2017  ? Combined fat and carbohydrate induced hyperlipemia 02/17/2015  ? Erectile dysfunction 12/10/2017  ? Plantar fasciitis 02/17/2015  ? Tendinitis of elbow or forearm 02/17/2015  ? Wears dentures   ? full upper, partial lower  ? ?Past Surgical History:  ?Procedure Laterality Date  ? COLONOSCOPY  2010  ? @ [facility]  ? COLONOSCOPY WITH PROPOFOL N/A 01/17/2018  ? Procedure: COLONOSCOPY WITH PROPOFOL WITH BIOPSIES;  Surgeon: Lucilla Lame, MD;  Location: [place];  Service: Endoscopy;  Laterality: N/A;  ? POLYPECTOMY N/A 01/17/2018  ? Procedure: POLYPECTOMY;  Surgeon: Lucilla Lame, MD;  Location: [place];  Service: Endoscopy;  Laterality: N/A;  ? ?Patient Active Problem List  ? Diagnosis Date Noted  ? Primary osteoarthritis of left knee 07/25/2021  ? Primary osteoarthritis of right knee 07/25/2021  ? Chronic pain of left knee 07/11/2021  ? Arthralgia of right knee 07/11/2021  ? Special screening for malignant neoplasms, colon   ? Rectal polyp   ? Polyp of sigmoid colon   ? Erectile  dysfunction 12/10/2017  ? BPH with obstruction/lower urinary tract symptoms 12/10/2017  ? AC (acromioclavicular) arthritis 02/17/2015  ? History of colon polyps 02/17/2015  ? Combined fat and carbohydrate induced hyperlipemia 02/17/2015  ? Plantar fasciitis 02/17/2015  ? Tendinitis of elbow or forearm 02/17/2015  ? Compulsive tobacco user syndrome 02/17/2015  ? ? ?REFERRING DIAG: M17.12 (ICD-10-CM) - Primary osteoarthritis of left knee ?M17.11 (ICD-10-CM) - Primary osteoarthritis of right knee ?  ?THERAPY DIAG:  ?Chronic pain of both knees ?  ?Muscle weakness (generalized) ?  ?Bilateral primary osteoarthritis of knee ?  ?PERTINENT HISTORY: Pt is a 61 year old male with primary complaint of L>R knee pain with Hx of bilateral knee OA. Per referring provider, medial tibiofemoral compartment involved primarily. Patient reports progressively worsening L knee pain more so than R knee. He states it has been bothering him about 6 months. Pt had significant joint effusion and this was aspirated when pt visited Dr. Zigmund Daniel. Pt has open-patella knee support/sleeve and uses Voltaren gel - he feels these help. Pt is using Celebrex at this time and he has just initiated this medication. Patient reports some tingling on bottom of his feet. Patient feels that prolonged work on concrete has contributed to his current condition (pt works in [employer], quantity as Company secretary).   ?  ?PRECAUTIONS: None ?  ?SUBJECTIVE: Patient reports feeling better recently during the night. He reports  continuing to take his prescribed meds and using knee braces. Patient reports having difficulty finding his parked vehicle on [facility] and was walking up to 45 minutes - he reports tolerating this volume of walking well and not focusing on pain at the time.   ?  ?  ?PAIN:  ?Are you having pain? "I don't know" per patient. Significant pain not reported  ?  ?  ?  ?TODAY'S TREATMENT: ?  ?  ?Manual Therapy - for symptom modulation, soft tissue  sensitivity and mobility, joint mobility as needed for pain-free knee complex ROM ?  ?STM and IASTM with Theraband roller, bilateral quadriceps ?Bilateral patellar glides, gr III in all planes for patellofemoral joint mobility ?Manual prone quadriceps stretch; x 1 minute bilaterally ?  ?*not today* ?Tibiofemoral glides anterior/posterior gr I-II for pain control in hooklying, L knee only today ?  ?  ?Therapeutic Exercise - for improved soft tissue flexibility and extensibility as needed for ROM, improved strength as needed to improve performance of [facility] activities/functional movements ?  ?NuStep; Level 4; 6 minutes, seat at 9, arms at 9 - subjective information discussed during this time  ?Bridge with Tband for hip abduction moment; Black Tband; 2x10 ?[employer] single-limb minisquat; 2x10, Level # 22, bilateral ?Sit to stand from raised table; 6-lb Goblet hold; 2x10 ?  ?PATIENT EDUCATION: Pt has questions concerning local resources for yoga/yoga instruction. Provided local reputable resources for yoga and printout was given to patient. ?  ? ? *not today* ?Total Gym squat with depth to tolerance; 20x, Level # 22 ?Straight leg raise; 3-lb ankle weights; 2x10 bilaterally ?            Sidelying hip abduction; 2-lb ankle weights; 2x10 bilaterally ?Prone Quad stretch; 2x30 seconds, bilateral  ?  ?  ?  ?PATIENT EDUCATION:  ?Education details: see above for patient education details ?Person educated: Patient ?Education method: Explanation, Demonstration, and Handouts ?Education comprehension: verbalized understanding and returned demonstration ?  ?  ?HOME EXERCISE PROGRAM: ?Access Code [email or web address] ?  ?  ?  ?  ?  ?  ?OBJECTIVE:  ?  ?DIAGNOSTIC FINDINGS:  ?Radiographs (07/11/21) ?  ?Right knee ?IMPRESSION: ?1. Moderate medial compartmental osteoarthritis. No acute bony ?abnormality. ?  ?Left knee ?IMPRESSION: ?1. Degenerative changes in the medial and patellofemoral ?compartments. No acute bony abnormality. ?2. Gas in the  suprapatellar joint space compatible with joint ?aspiration 2 days ago. ?  ?  ?  ?  ?PATIENT SURVEYS:  ?FOTO 73 ?  ?MUSCLE LENGTH: ?Hamstrings: Right WFL; Left WFL ?Ely's: R Positive, L Positive ?  ?POSTURE:  ?Forward flexed posture in standing and forward headed rounded shoulders in static sitting and standing ?  ?PALPATION: ?Tenderness to palpation along L>R medial tibiofemoral joint line ?  ?LE ROM: ?  ?Active ROM Right ?08/02/2021 Left ?08/02/2021  ?Hip flexion      ?Hip extension      ?Hip abduction      ?Hip adduction      ?Hip internal rotation      ?Hip external rotation      ?Knee flexion 138 139  ?Knee extension +3 +5  ?Ankle dorsiflexion 18 19  ?Ankle plantarflexion      ?Ankle inversion      ?Ankle eversion      ? (Blank rows = not tested) ?  ?LE MMT: ?  ?MMT Right ?08/02/2021 Left ?08/02/2021  ?Hip flexion 4 4+  ?Hip extension      ?Hip  abduction 4+ 4  ?Hip adduction 5 5  ?Hip internal rotation 5 5  ?Hip external rotation 5 5  ?Knee flexion 5 5  ?Knee extension 5 5  ?Ankle dorsiflexion 5 5  ?Ankle plantarflexion 5 5  ?Ankle inversion 5 5  ?Ankle eversion 5 5  ? (Blank rows = not tested) ?  ?  ?  ?PASSIVE ACCESSORY JOINT MOBILITY ?Patellar: decreased patellar glide in all planes, moderate restriction   ?            ?  ?  ?LOWER EXTREMITY SPECIAL TESTS:  ?Knee special tests: McMurray's test: negative, Varus stress negative, Valgus stress negative, Anterior drawer negative ?  ?  ?GAIT: ?Patient demonstrates decreased forward flexed trunk through gait cycle, mild decreased heel strike at initial contact ?  ?  ?  ?  ?ASSESSMENT: ?  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 Patient utilizes limited ROM 0-45 deg for single-limb lowering at this time (on [employer] Gym for partial body weight). Pt is making good progress so far, but he does have barriers limiting his full participation in HEP.  Patient has remaining deficits in hip flexor and abductor strength, L>R tibiofemoral joint edema, decreased L>R patellar mobility, mild gait changes, quadriceps tightness bilaterally. He has associated activity

## 2021-08-24 ENCOUNTER — Encounter: Payer: BC Managed Care – PPO | Admitting: Physical Therapy

## 2021-08-29 ENCOUNTER — Ambulatory Visit: Payer: BC Managed Care – PPO | Admitting: Physical Therapy

## 2021-08-29 ENCOUNTER — Encounter: Payer: Self-pay | Admitting: Physical Therapy

## 2021-08-29 ENCOUNTER — Encounter: Payer: BC Managed Care – PPO | Admitting: Physical Therapy

## 2021-08-29 DIAGNOSIS — M25562 Pain in left knee: Secondary | ICD-10-CM | POA: Diagnosis not present

## 2021-08-29 DIAGNOSIS — M170 Bilateral primary osteoarthritis of knee: Secondary | ICD-10-CM | POA: Diagnosis not present

## 2021-08-29 DIAGNOSIS — M6281 Muscle weakness (generalized): Secondary | ICD-10-CM

## 2021-08-29 DIAGNOSIS — G8929 Other chronic pain: Secondary | ICD-10-CM

## 2021-08-29 DIAGNOSIS — M25561 Pain in right knee: Secondary | ICD-10-CM | POA: Diagnosis not present

## 2021-08-29 NOTE — Therapy (Signed)
?OUTPATIENT PHYSICAL THERAPY TREATMENT NOTE ? ? ?Patient Name: Joel Humphrey ?MRN: [id number] ?DOB:Mar 10, 1961, 61 y.o., male ?Today's Date: 08/29/2021 ? ?PCP: [name], MD ?REFERRING PROVIDER: Montel Culver, MD ? ?END OF SESSION:  ? PT End of Session - 08/29/21 0944   ? ? Visit Number 6   ? Number of Visits 11   ? Date for PT Re-Evaluation 09/12/21   ? Authorization Type BC[employer] - 18 visits per calendar year   ? Progress Note Due on Visit 10   ? PT Start Time [phone]   ? PT Stop Time 5809   ? PT Time Calculation (min) 38 min   ? Equipment Utilized During Treatment --   pt uses open-patellar compression sleeve on bilateral knees  ? Activity Tolerance Patient tolerated treatment well;No increased pain   ? Behavior During Therapy [facility] for tasks assessed/performed   ? ?  ?  ? ?  ? ? ? ?Past Medical History:  ?Diagnosis Date  ? AC (acromioclavicular) arthritis 02/17/2015  ? BPH with obstruction/lower urinary tract symptoms 12/10/2017  ? Combined fat and carbohydrate induced hyperlipemia 02/17/2015  ? Erectile dysfunction 12/10/2017  ? Plantar fasciitis 02/17/2015  ? Tendinitis of elbow or forearm 02/17/2015  ? Wears dentures   ? full upper, partial lower  ? ?Past Surgical History:  ?Procedure Laterality Date  ? COLONOSCOPY  2010  ? @ [facility]  ? COLONOSCOPY WITH PROPOFOL N/A 01/17/2018  ? Procedure: COLONOSCOPY WITH PROPOFOL WITH BIOPSIES;  Surgeon: Lucilla Lame, MD;  Location: [place];  Service: Endoscopy;  Laterality: N/A;  ? POLYPECTOMY N/A 01/17/2018  ? Procedure: POLYPECTOMY;  Surgeon: Lucilla Lame, MD;  Location: [place];  Service: Endoscopy;  Laterality: N/A;  ? ?Patient Active Problem List  ? Diagnosis Date Noted  ? Primary osteoarthritis of left knee 07/25/2021  ? Primary osteoarthritis of right knee 07/25/2021  ? Chronic pain of left knee 07/11/2021  ? Arthralgia of right knee 07/11/2021  ? Special screening for malignant neoplasms, colon   ? Rectal polyp   ? Polyp of sigmoid colon   ? Erectile  dysfunction 12/10/2017  ? BPH with obstruction/lower urinary tract symptoms 12/10/2017  ? AC (acromioclavicular) arthritis 02/17/2015  ? History of colon polyps 02/17/2015  ? Combined fat and carbohydrate induced hyperlipemia 02/17/2015  ? Plantar fasciitis 02/17/2015  ? Tendinitis of elbow or forearm 02/17/2015  ? Compulsive tobacco user syndrome 02/17/2015  ? ? ?REFERRING DIAG: M17.12 (ICD-10-CM) - Primary osteoarthritis of left knee ?M17.11 (ICD-10-CM) - Primary osteoarthritis of right knee ?  ?THERAPY DIAG:  ?Chronic pain of both knees ?  ?Muscle weakness (generalized) ?  ?Bilateral primary osteoarthritis of knee ?  ?PERTINENT HISTORY: Pt is a 61 year old male with primary complaint of L>R knee pain with Hx of bilateral knee OA. Per referring provider, medial tibiofemoral compartment involved primarily. Patient reports progressively worsening L knee pain more so than R knee. He states it has been bothering him about 6 months. Pt had significant joint effusion and this was aspirated when pt visited Dr. Zigmund Daniel. Pt has open-patella knee support/sleeve and uses Voltaren gel - he feels these help. Pt is using Celebrex at this time and he has just initiated this medication. Patient reports some tingling on bottom of his feet. Patient feels that prolonged work on concrete has contributed to his current condition (pt works in [employer], quantity as Company secretary).   ?  ?PRECAUTIONS: None ?  ?SUBJECTIVE: Patient has been out of town for his niece's  graduation. He drove home from [place], worked 3rd shift and just got off work; he hasn't slept in over 22 hours. He is wearing bilateral knee sleeves. He has not had time to try out yoga since asking about it last session; he is still interested.  ? ?  ?  ?PAIN:  ?Reports pain "everywhere" however none significant.  ?  ?  ?  ?TODAY'S TREATMENT: ?  ?  ?Manual Therapy -  ?  ?IASTM with Theraband roller, bilateral quadriceps ?Bilateral patellar glides, gr III in all planes for patellofemoral  joint mobility ?Manual prone quadriceps stretch w/ knee prop on pillow; 2 x 1 minute bilaterally ?  ?*not today* ?Tibiofemoral glides anterior/posterior gr I-II for pain control in hooklying, L knee only today ?  ?  ?Therapeutic Exercise  ?  ?NuStep; Level 4; 5 minutes, seat at 9, arms at 9 - subjective information discussed during this time  ? ?Total Gym single-limb minisquat; 2x10, Level # 22, bilateral ?Sit to stand from EOM; 6-lb Goblet hold; x10 ?Staggered stance sit to stand from EOM; 6-lb Goblet hold; 2x10 bilateral ?Lateral stepping BlueTB at ankles, 2x10 bilateral ? ? ?PATIENT EDUCATION: POC ?  ? ? *not today* ?Total Gym squat with depth to tolerance; 20x, Level # 22 ?Straight leg raise; 3-lb ankle weights; 2x10 bilaterally ?            Sidelying hip abduction; 2-lb ankle weights; 2x10 bilaterally ?Prone Quad stretch; 2x30 seconds, bilateral  ?Bridge with Tband for hip abduction moment; Black Tband; 2x10 ?  ?  ?  ?PATIENT EDUCATION:  ?Education details: see above for patient education details ?Person educated: Patient ?Education method: Explanation, Demonstration, and Handouts ?Education comprehension: verbalized understanding and returned demonstration ?  ?  ?HOME EXERCISE PROGRAM: ?Access Code [email or web address] ?  ?  ?  ?  ?  ?  ?OBJECTIVE:  ?  ?DIAGNOSTIC FINDINGS:  ?Radiographs (07/11/21) ?  ?Right knee ?IMPRESSION: ?1. Moderate medial compartmental osteoarthritis. No acute bony ?abnormality. ?  ?Left knee ?IMPRESSION: ?1. Degenerative changes in the medial and patellofemoral ?compartments. No acute bony abnormality. ?2. Gas in the suprapatellar joint space compatible with joint ?aspiration 2 days ago. ?  ?  ?  ?  ?PATIENT SURVEYS:  ?FOTO 88 ?  ?MUSCLE LENGTH: ?Hamstrings: Right WFL; Left WFL ?Ely's: R Positive, L Positive ?  ?POSTURE:  ?Forward flexed posture in standing and forward headed rounded shoulders in static sitting and standing ?  ?PALPATION: ?Tenderness to palpation along L>R medial tibiofemoral joint  line ?  ?LE ROM: ?  ?Active ROM Right ?08/02/2021 Left ?08/02/2021  ?Hip flexion      ?Hip extension      ?Hip abduction      ?Hip adduction      ?Hip internal rotation      ?Hip external rotation      ?Knee flexion 138 139  ?Knee extension +3 +5  ?Ankle dorsiflexion 18 19  ?Ankle plantarflexion      ?Ankle inversion      ?Ankle eversion      ? (Blank rows = not tested) ?  ?LE MMT: ?  ?MMT Right ?08/02/2021 Left ?08/02/2021  ?Hip flexion 4 4+  ?Hip extension      ?Hip abduction 4+ 4  ?Hip adduction 5 5  ?Hip internal rotation 5 5  ?Hip external rotation 5 5  ?Knee flexion 5 5  ?Knee extension 5 5  ?Ankle dorsiflexion 5 5  ?Ankle plantarflexion 5 5  ?Ankle inversion 5  5  ?Ankle eversion 5 5  ? (Blank rows = not tested) ?  ?  ?  ?PASSIVE ACCESSORY JOINT MOBILITY ?Patellar: decreased patellar glide in all planes, moderate restriction   ?            ?  ?  ?LOWER EXTREMITY SPECIAL TESTS:  ?Knee special tests: McMurray's test: negative, Varus stress negative, Valgus stress negative, Anterior drawer negative ?  ?  ?GAIT: ?Patient demonstrates decreased forward flexed trunk through gait cycle, mild decreased heel strike at initial contact ?  ?  ?  ?  ?ASSESSMENT: ?  ?CLINICAL IMPRESSION: ?Pt is pleasant and demonstrates good motivation in session. POC continues to challenge closed-chain loading. Today STS was progressed with staggered stance. Crepitus is heard in bilateral knees however no significant pain accompanies. Hip flexors were engaged in stretching this date; quad length remains limited. Limitations in hip flexor and abductor strength, patellar mobility, gait changes and mild TF joint edema remain. Pt will benefit from skilled PT services to address the above deficits and improve function and QoL.  ? ? ?  ?OBJECTIVE IMPAIRMENTS Abnormal gait, decreased strength, hypomobility, increased edema, impaired flexibility, and pain.  ?  ?ACTIVITY LIMITATIONS community activity and occupation.  ?  ?PERSONAL FACTORS Past/current  experiences, Profession, Time since onset of injury/illness/exacerbation, and Transportation are also affecting patient's functional outcome.  ?  ?  ?  ?  ?GOALS: ?  ?  ?SHORT TERM GOALS: Target date

## 2021-08-31 ENCOUNTER — Ambulatory Visit: Payer: BC Managed Care – PPO | Admitting: Physical Therapy

## 2021-08-31 ENCOUNTER — Encounter: Payer: BC Managed Care – PPO | Admitting: Physical Therapy

## 2021-09-05 ENCOUNTER — Encounter: Payer: BC Managed Care – PPO | Admitting: Physical Therapy

## 2021-09-07 ENCOUNTER — Ambulatory Visit: Payer: BC Managed Care – PPO | Admitting: Physical Therapy

## 2021-09-07 ENCOUNTER — Encounter: Payer: BC Managed Care – PPO | Admitting: Physical Therapy

## 2021-09-07 ENCOUNTER — Encounter: Payer: Self-pay | Admitting: Physical Therapy

## 2021-09-07 DIAGNOSIS — G8929 Other chronic pain: Secondary | ICD-10-CM | POA: Diagnosis not present

## 2021-09-07 DIAGNOSIS — M25561 Pain in right knee: Secondary | ICD-10-CM | POA: Diagnosis not present

## 2021-09-07 DIAGNOSIS — M25562 Pain in left knee: Secondary | ICD-10-CM | POA: Diagnosis not present

## 2021-09-07 DIAGNOSIS — M6281 Muscle weakness (generalized): Secondary | ICD-10-CM | POA: Diagnosis not present

## 2021-09-07 DIAGNOSIS — M170 Bilateral primary osteoarthritis of knee: Secondary | ICD-10-CM

## 2021-09-07 NOTE — Therapy (Signed)
OUTPATIENT PHYSICAL THERAPY TREATMENT NOTE   Patient Name: Joel Humphrey MRN: [id number] DOB:1960/05/29, 61 y.o., male Today's Date: 09/07/2021  PCP: [name], MD REFERRING PROVIDER: Montel Culver, MD  END OF SESSION:   PT End of Session - 09/07/21 0923     Visit Number 7    Number of Visits 11    Date for PT Re-Evaluation 09/12/21    Authorization Type BCBS - 18 visits per calendar year    Progress Note Due on Visit 10    PT Start Time 0921    PT Stop Time 0945    PT Time Calculation (min) 24 min    Equipment Utilized During Treatment --   pt uses open-patellar compression sleeve on bilateral knees   Activity Tolerance Patient tolerated treatment well;No increased pain    Behavior During Therapy [facility] for tasks assessed/performed               Past Medical History:  Diagnosis Date   AC (acromioclavicular) arthritis 02/17/2015   BPH with obstruction/lower urinary tract symptoms 12/10/2017   Combined fat and carbohydrate induced hyperlipemia 02/17/2015   Erectile dysfunction 12/10/2017   Plantar fasciitis 02/17/2015   Tendinitis of elbow or forearm 02/17/2015   Wears dentures    full upper, partial lower   Past Surgical History:  Procedure Laterality Date   COLONOSCOPY  2010   @ [facility]   COLONOSCOPY WITH PROPOFOL N/A 01/17/2018   Procedure: COLONOSCOPY WITH PROPOFOL WITH BIOPSIES;  Surgeon: Lucilla Lame, MD;  Location: [place];  Service: Endoscopy;  Laterality: N/A;   POLYPECTOMY N/A 01/17/2018   Procedure: POLYPECTOMY;  Surgeon: Lucilla Lame, MD;  Location: [place];  Service: Endoscopy;  Laterality: N/A;   Patient Active Problem List   Diagnosis Date Noted   Primary osteoarthritis of left knee 07/25/2021   Primary osteoarthritis of right knee 07/25/2021   Chronic pain of left knee 07/11/2021   Arthralgia of right knee 07/11/2021   Special screening for malignant neoplasms, colon    Rectal polyp    Polyp of sigmoid colon     Erectile dysfunction 12/10/2017   BPH with obstruction/lower urinary tract symptoms 12/10/2017   AC (acromioclavicular) arthritis 02/17/2015   History of colon polyps 02/17/2015   Combined fat and carbohydrate induced hyperlipemia 02/17/2015   Plantar fasciitis 02/17/2015   Tendinitis of elbow or forearm 02/17/2015   Compulsive tobacco user syndrome 02/17/2015    REFERRING DIAG: M17.12 (ICD-10-CM) - Primary osteoarthritis of left knee M17.11 (ICD-10-CM) - Primary osteoarthritis of right knee   THERAPY DIAG:  Chronic pain of both knees   Muscle weakness (generalized)   Bilateral primary osteoarthritis of knee   PERTINENT HISTORY: Pt is a 61 year old male with primary complaint of L>R knee pain with Hx of bilateral knee OA. Per referring provider, medial tibiofemoral compartment involved primarily. Patient reports progressively worsening L knee pain more so than R knee. He states it has been bothering him about 6 months. Pt had significant joint effusion and this was aspirated when pt visited Dr. Zigmund Daniel. Pt has open-patella knee support/sleeve and uses Voltaren gel - he feels these help. Pt is using Celebrex at this time and he has just initiated this medication. Patient reports some tingling on bottom of his feet. Patient feels that prolonged work on concrete has contributed to his current condition (pt works in [employer], quantity as Company secretary).     PRECAUTIONS: None   SUBJECTIVE: Patient states his grandson overslept and that is  why he is running late today. He is wearing his knee braces; denies pain. States the only time he really gets pain is when he first wakes up and takes a wrong step. States he just got back to town, he has been celebrating a friend in [place].        PAIN:  Denies         TODAY'S TREATMENT:     Manual Therapy -    IASTM with Theraband roller, bilateral quadriceps Manual prone quadriceps stretch w/ knee prop on pillow; 2 x 1 minute bilaterally   *not  today* Tibiofemoral glides anterior/posterior gr I-II for pain control in hooklying, L knee only today     Therapeutic Exercise    NuStep; Level 5; 5 minutes, seat at 9, arms at 9 - subjective information discussed during this time     Total Gym squat with depth to tolerance; 20x, Level # 22 Total Gym single-limb minisquat; 2x10, Level # 22, bilateral        *not today* Total Gym squat with depth to tolerance; 20x, Level # 22 Straight leg raise; 3-lb ankle weights; 2x10 bilaterally             Sidelying hip abduction; 2-lb ankle weights; 2x10 bilaterally Prone Quad stretch; 2x30 seconds, bilateral  Bridge with Tband for hip abduction moment; Black Tband; 2x10       PATIENT EDUCATION:  Education details: see above for patient education details Person educated: Patient Education method: Explanation, Demonstration, and Handouts Education comprehension: verbalized understanding and returned demonstration     HOME EXERCISE PROGRAM: Access Code [email or web address]             OBJECTIVE:    DIAGNOSTIC FINDINGS:  Radiographs (07/11/21)   Right knee IMPRESSION: 1. Moderate medial compartmental osteoarthritis. No acute bony abnormality.   Left knee IMPRESSION: 1. Degenerative changes in the medial and patellofemoral compartments. No acute bony abnormality. 2. Gas in the suprapatellar joint space compatible with joint aspiration 2 days ago.         PATIENT SURVEYS:  FOTO 59   MUSCLE LENGTH: Hamstrings: Right WFL; Left WFL Ely's: R Positive, L Positive   POSTURE:  Forward flexed posture in standing and forward headed rounded shoulders in static sitting and standing   PALPATION: Tenderness to palpation along L>R medial tibiofemoral joint line   LE ROM:   Active ROM Right 08/02/2021 Left 08/02/2021  Hip flexion      Hip extension      Hip abduction      Hip adduction      Hip internal rotation      Hip external rotation      Knee flexion 138 139  Knee extension  +3 +5  Ankle dorsiflexion 18 19  Ankle plantarflexion      Ankle inversion      Ankle eversion       (Blank rows = not tested)   LE MMT:   MMT Right 08/02/2021 Left 08/02/2021  Hip flexion 4 4+  Hip extension      Hip abduction 4+ 4  Hip adduction 5 5  Hip internal rotation 5 5  Hip external rotation 5 5  Knee flexion 5 5  Knee extension 5 5  Ankle dorsiflexion 5 5  Ankle plantarflexion 5 5  Ankle inversion 5 5  Ankle eversion 5 5   (Blank rows = not tested)       PASSIVE ACCESSORY JOINT MOBILITY Patellar: decreased patellar glide in all planes,  moderate restriction                   LOWER EXTREMITY SPECIAL TESTS:  Knee special tests: McMurray's test: negative, Varus stress negative, Valgus stress negative, Anterior drawer negative     GAIT: Patient demonstrates decreased forward flexed trunk through gait cycle, mild decreased heel strike at initial contact         ASSESSMENT:   CLINICAL IMPRESSION: Pt is pleasant and demonstrates good motivation in session. Interventions limited by tardiness. Continued POC with bilateral and single-leg closed-chain loading. Muscular fatigue noted with single leg exercises, no pain reported. Limitations in hip flexor and abductor strength, patellar mobility, gait changes and mild TF joint edema remain. Pt will benefit from skilled PT services to address the above deficits and improve function and QoL.      OBJECTIVE IMPAIRMENTS Abnormal gait, decreased strength, hypomobility, increased edema, impaired flexibility, and pain.    ACTIVITY LIMITATIONS community activity and occupation.    PERSONAL FACTORS Past/current experiences, Profession, Time since onset of injury/illness/exacerbation, and Transportation are also affecting patient's functional outcome.          GOALS:     SHORT TERM GOALS: Target date: 08/23/2021   Patient will be independent and 100% compliant with HEP as needed to improve mobility deficits and strength  as needed for improved function and ability to complete prolonged standing work duties Baseline: 08/01/21: Baseline HEP initiated Goal status: INITIAL     LONG TERM GOALS: Target date: 09/13/2021   Patient will demonstrate improved function as evidenced by a score of 79 on FOTO measure for full participation in activities at home and in the community.   Baseline: 08/01/21: 59 Goal status: INITIAL   2.  Patient will have NPRS no greater than 2/10 at worst with work duties, household tasks, self-care, and community-level ambulation Baseline: 08/01/21: Pain 8/10 at worst Goal status: INITIAL   3.  Patient will have MMT 5/5 for hip flexors and abductors indicative of improved strength as needed for improved proximal stability during closed-chain loading c patient's standing work duties Baseline: 08/01/21: MMT R/L Hip flexion 4+/4; hip abduction 4/4+ Goal status: INITIAL   4.  Patient will perform 3-way mini-lunge (anterior, anterolateral, and lateral) with sound technique and no reproduction of pain requiring pivoting over stance LE, indicative of improved ability to perform pivoting during standing work duties Baseline: 08/01/21 Goal status: INITIAL         PLAN: PT FREQUENCY: 2x/week, with anticipated tapered visits after 3-4 weeks of PT   PT DURATION: 6 weeks   PLANNED INTERVENTIONS: Therapeutic exercises, Therapeutic activity, Neuromuscular re-education, Balance training, Gait training, Patient/Family education, and Joint mobilization   PLAN FOR NEXT SESSION: Patellar mobilization, manual therapy for quadriceps flexibility/extensibility, strategies for edema control prn. Continue with hip strengthening with focus on closed chain.      Patrina Levering PT, DPT

## 2021-09-12 ENCOUNTER — Encounter: Payer: BC Managed Care – PPO | Admitting: Physical Therapy

## 2021-09-14 ENCOUNTER — Ambulatory Visit: Payer: BC Managed Care – PPO | Admitting: Physical Therapy

## 2021-09-14 ENCOUNTER — Encounter: Payer: BC Managed Care – PPO | Admitting: Physical Therapy

## 2021-09-14 ENCOUNTER — Encounter: Payer: Self-pay | Admitting: Physical Therapy

## 2021-09-14 DIAGNOSIS — M170 Bilateral primary osteoarthritis of knee: Secondary | ICD-10-CM

## 2021-09-14 DIAGNOSIS — M25561 Pain in right knee: Secondary | ICD-10-CM | POA: Diagnosis not present

## 2021-09-14 DIAGNOSIS — M6281 Muscle weakness (generalized): Secondary | ICD-10-CM

## 2021-09-14 DIAGNOSIS — M25562 Pain in left knee: Secondary | ICD-10-CM | POA: Diagnosis not present

## 2021-09-14 DIAGNOSIS — G8929 Other chronic pain: Secondary | ICD-10-CM | POA: Diagnosis not present

## 2021-09-14 NOTE — Therapy (Signed)
OUTPATIENT PHYSICAL THERAPY TREATMENT NOTE/RE-CERTIFICATION NOTE  Reporting Period: 08/01/21 - 09/14/21   Patient Name: Joel Humphrey MRN: [id number] DOB:Nov 26, 1960, 61 y.o., male Today's Date: 09/14/2021  PCP: [name], MD REFERRING PROVIDER: Montel Culver, MD  END OF SESSION:   PT End of Session - 09/14/21 1045     Visit Number 8    Number of Visits 12    Date for PT Re-Evaluation 10/12/21    Authorization Type [employer] - 18 visits per calendar year    Progress Note Due on Visit 10    PT Start Time 0905    PT Stop Time 0946    PT Time Calculation (min) 41 min    Equipment Utilized During Treatment --   pt uses open-patellar compression sleeve on bilateral knees   Activity Tolerance Patient tolerated treatment well;No increased pain    Behavior During Therapy [facility] for tasks assessed/performed                Past Medical History:  Diagnosis Date   AC (acromioclavicular) arthritis 02/17/2015   BPH with obstruction/lower urinary tract symptoms 12/10/2017   Combined fat and carbohydrate induced hyperlipemia 02/17/2015   Erectile dysfunction 12/10/2017   Plantar fasciitis 02/17/2015   Tendinitis of elbow or forearm 02/17/2015   Wears dentures    full upper, partial lower   Past Surgical History:  Procedure Laterality Date   COLONOSCOPY  2010   @ [facility]   COLONOSCOPY WITH PROPOFOL N/A 01/17/2018   Procedure: COLONOSCOPY WITH PROPOFOL WITH BIOPSIES;  Surgeon: Lucilla Lame, MD;  Location: [place];  Service: Endoscopy;  Laterality: N/A;   POLYPECTOMY N/A 01/17/2018   Procedure: POLYPECTOMY;  Surgeon: Lucilla Lame, MD;  Location: [place];  Service: Endoscopy;  Laterality: N/A;   Patient Active Problem List   Diagnosis Date Noted   Primary osteoarthritis of left knee 07/25/2021   Primary osteoarthritis of right knee 07/25/2021   Chronic pain of left knee 07/11/2021   Arthralgia of right knee 07/11/2021   Special screening for malignant  neoplasms, colon    Rectal polyp    Polyp of sigmoid colon    Erectile dysfunction 12/10/2017   BPH with obstruction/lower urinary tract symptoms 12/10/2017   AC (acromioclavicular) arthritis 02/17/2015   History of colon polyps 02/17/2015   Combined fat and carbohydrate induced hyperlipemia 02/17/2015   Plantar fasciitis 02/17/2015   Tendinitis of elbow or forearm 02/17/2015   Compulsive tobacco user syndrome 02/17/2015    REFERRING DIAG: M17.12 (ICD-10-CM) - Primary osteoarthritis of left knee M17.11 (ICD-10-CM) - Primary osteoarthritis of right knee   THERAPY DIAG:  Chronic pain of both knees   Muscle weakness (generalized)   Bilateral primary osteoarthritis of knee   PERTINENT HISTORY: Pt is a 61 year old male with primary complaint of L>R knee pain with Hx of bilateral knee OA. Per referring provider, medial tibiofemoral compartment involved primarily. Patient reports progressively worsening L knee pain more so than R knee. He states it has been bothering him about 6 months. Pt had significant joint effusion and this was aspirated when pt visited Dr. Zigmund Daniel. Pt has open-patella knee support/sleeve and uses Voltaren gel - he feels these help. Pt is using Celebrex at this time and he has just initiated this medication. Patient reports some tingling on bottom of his feet. Patient feels that prolonged work on concrete has contributed to his current condition (pt works in [employer], quantity as Company secretary).     PRECAUTIONS: None   SUBJECTIVE:  Pt states he is tired today. When asked about pain he states "no not really". He is wearing his knee braces. No new updates since last session.      PAIN:  Denies         TODAY'S TREATMENT:     Manual Therapy -    IASTM with Theraband roller, bilateral quadriceps, x5 minutes Patellar glides, medial-lateral and superior-inferior; x1 minute each direction, each LE   *not today* Tibiofemoral glides anterior/posterior gr I-II for pain control  in hooklying, L knee only today     Therapeutic Exercise    NuStep; Level 4; 5 minutes, seat at 9, arms at 9 - subjective information discussed during this time     Total Gym squat with depth to tolerance; 2x25, Level # 22 Total Gym single-limb minisquat; 2x10, Level # 22, bilateral Step ups to 6" step, x10 BLE; 12" step 2x10 BLE Squats, tap to chair, with 15# weight; 2x10 Reverse lunge, 2x10 BLE  - light UE support for balance/safety  Supine Thomas stretch, x2 minutes bilaterally (as PT performed IASTM to alternate quad)   Progressed HEP with squats, step ups and lunges.     *next session*  Lateral stepping, GreenTB, 2x10 each direction     *not today* Straight leg raise; 3-lb ankle weights; 2x10 bilaterally Sidelying hip abduction; 2-lb ankle weights; 2x10 bilaterally Bridge with Tband for hip abduction moment; Black Tband; 2x10      PATIENT EDUCATION:  Education details: see above for patient education details Person educated: Patient Education method: Explanation, Demonstration, and Handouts Education comprehension: verbalized understanding and returned demonstration     HOME EXERCISE PROGRAM: Access Code [email or web address]             OBJECTIVE:    DIAGNOSTIC FINDINGS:  Radiographs (07/11/21)   Right knee IMPRESSION: 1. Moderate medial compartmental osteoarthritis. No acute bony abnormality.   Left knee IMPRESSION: 1. Degenerative changes in the medial and patellofemoral compartments. No acute bony abnormality. 2. Gas in the suprapatellar joint space compatible with joint aspiration 2 days ago.         PATIENT SURVEYS:  FOTO 59 >> 63   MUSCLE LENGTH: Hamstrings: Right WFL; Left WFL Ely's: R Positive, L Positive   POSTURE:  Forward flexed posture in standing and forward headed rounded shoulders in static sitting and standing   PALPATION: Tenderness to palpation along L>R medial tibiofemoral joint line   LE ROM:   Active ROM Right 08/02/2021  Left 08/02/2021  Hip flexion      Hip extension      Hip abduction      Hip adduction      Hip internal rotation      Hip external rotation      Knee flexion 138 139  Knee extension +3 +5  Ankle dorsiflexion 18 19  Ankle plantarflexion      Ankle inversion      Ankle eversion       (Blank rows = not tested)   LE MMT:   MMT Right 08/02/2021 Left 08/02/2021 Right 08/02/2021 Left 08/02/2021  Hip flexion 4 4+ 4+ 4+  Hip extension        Hip abduction 4+ 4 4+ 4+  Hip adduction 5 5    Hip internal rotation 5 5    Hip external rotation 5 5    Knee flexion 5 5    Knee extension 5 5    Ankle dorsiflexion 5 5    Ankle plantarflexion 5 5  Ankle inversion 5 5    Ankle eversion 5 5     (Blank rows = not tested)       PASSIVE ACCESSORY JOINT MOBILITY Patellar: decreased patellar glide in all planes, moderate restriction                   LOWER EXTREMITY SPECIAL TESTS:  Knee special tests: McMurray's test: negative, Varus stress negative, Valgus stress negative, Anterior drawer negative     GAIT: Patient demonstrates decreased forward flexed trunk through gait cycle, mild decreased heel strike at initial contact         ASSESSMENT:   CLINICAL IMPRESSION: Pt is pleasant and demonstrates good motivation. PT assessed progress and goals this date for re-certification. Pt is making good progress in strength and is now experiencing less significant pain than initial evaluation. Although improving, he does demonstrate area for improvement in hip strength and overall control of functional movements such as squats and lunges. Muscular fatigue set in quickly, especially during lunges. He lacks trunk control and overall LE strength during lunge activity. Increased crepitus noted during right patellar glides compared to left. Limitations remain in hip flexor and abductor strength, patellar mobility, pain and functional performance. Pt will benefit from skilled PT services to address the  above deficits and improve function and QoL.       OBJECTIVE IMPAIRMENTS Abnormal gait, decreased strength, hypomobility, increased edema, impaired flexibility, and pain.    ACTIVITY LIMITATIONS community activity and occupation.    PERSONAL FACTORS Past/current experiences, Profession, Time since onset of injury/illness/exacerbation, and Transportation are also affecting patient's functional outcome.          GOALS:     SHORT TERM GOALS: Target date: 08/23/2021   Patient will be independent and 100% compliant with HEP as needed to improve mobility deficits and strength as needed for improved function and ability to complete prolonged standing work duties Baseline: 08/01/21: Baseline HEP initiated Goal status: MET     LONG TERM GOALS: Target date: 09/13/2021   Patient will demonstrate improved function as evidenced by a score of 79 on FOTO measure for full participation in activities at home and in the community.   Baseline: 08/01/21: 59; 09/14/21: 63 Goal status: ON-GOING   2.  Patient will have NPRS no greater than 2/10 at worst with work duties, household tasks, self-care, and community-level ambulation Baseline: 08/01/21: Pain 8/10 at worst; 09/14/21: 6/10 Goal status: ON-GOING   3.  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  Patient will perform 3-way mini-lunge (anterior, anterolateral, and lateral) with sound technique and no reproduction of pain requiring pivoting over stance LE, indicative of improved ability to perform pivoting during standing work duties Baseline: 08/01/21; 09/14/21: fells strain, no pain; decreased control due to weakness, could improve technique Goal status: ON-GOING          PLAN: PT FREQUENCY: 2x/week, with anticipated tapered visits after 3-4 weeks of PT   PT DURATION: 6 weeks   PLANNED INTERVENTIONS: Therapeutic exercises, Therapeutic activity, Neuromuscular re-education, Balance training, Gait training, Patient/Family education, and Joint mobilization   PLAN FOR NEXT SESSION: Patellar mobilization, manual therapy for quadriceps flexibility/extensibility. Continue with hip strengthening with focus on closed chain. Progress functional strengthening activities.  Patrina Levering PT, DPT

## 2021-09-21 ENCOUNTER — Encounter: Payer: BC Managed Care – PPO | Admitting: Physical Therapy

## 2021-09-25 ENCOUNTER — Encounter: Payer: Self-pay | Admitting: Internal Medicine

## 2021-09-25 ENCOUNTER — Other Ambulatory Visit: Payer: Self-pay

## 2021-09-25 ENCOUNTER — Ambulatory Visit (INDEPENDENT_AMBULATORY_CARE_PROVIDER_SITE_OTHER): Payer: BC Managed Care – PPO | Admitting: Internal Medicine

## 2021-09-25 VITALS — BP 104/76 | HR 72 | Ht 68.0 in | Wt 161.4 lb

## 2021-09-25 DIAGNOSIS — Z1159 Encounter for screening for other viral diseases: Secondary | ICD-10-CM

## 2021-09-25 DIAGNOSIS — Z23 Encounter for immunization: Secondary | ICD-10-CM | POA: Diagnosis not present

## 2021-09-25 DIAGNOSIS — R053 Chronic cough: Secondary | ICD-10-CM | POA: Diagnosis not present

## 2021-09-25 DIAGNOSIS — N401 Enlarged prostate with lower urinary tract symptoms: Secondary | ICD-10-CM

## 2021-09-25 DIAGNOSIS — Z0000 Encounter for general adult medical examination without abnormal findings: Secondary | ICD-10-CM | POA: Diagnosis not present

## 2021-09-25 DIAGNOSIS — N138 Other obstructive and reflux uropathy: Secondary | ICD-10-CM | POA: Diagnosis not present

## 2021-09-25 DIAGNOSIS — F17209 Nicotine dependence, unspecified, with unspecified nicotine-induced disorders: Secondary | ICD-10-CM

## 2021-09-25 DIAGNOSIS — E782 Mixed hyperlipidemia: Secondary | ICD-10-CM

## 2021-09-25 DIAGNOSIS — M1712 Unilateral primary osteoarthritis, left knee: Secondary | ICD-10-CM

## 2021-09-25 MED ORDER — VARENICLINE TARTRATE 0.5 MG X 11 & 1 MG X 42 PO TBPK: ORAL_TABLET | ORAL | 0 refills | Status: AC

## 2021-09-25 NOTE — Progress Notes (Signed)
Date:  09/25/2021   Name:  Joel Humphrey   DOB:  October 23, 1960   MRN:  [id number]   Chief Complaint: New Patient (Initial Visit) and Cough Joel Humphrey is a 61 y.o. male who presents today for his Complete Annual Exam. He feels fairly well. He reports exercising rarely. He reports he is sleeping well.   Colonoscopy: 2019 repeat 2024  Immunization History  Administered Date(s) Administered   PPD Test 05/20/2007   Zoster, Live 09/25/2013   Health Maintenance Due  Topic Date Due   Pneumococcal Vaccine 38-73 Years old (1 - PCV) Never done   HIV Screening  Never done   Hepatitis C Screening  Never done   TETANUS/TDAP  Never done   Zoster Vaccines- Shingrix (1 of 2) Never done    Lab Results  Component Value Date   PSA 1.1 09/25/2013    Cough This is a recurrent problem. The problem has been unchanged. Episode frequency: usually in the am or after sleeping. Pertinent negatives include no chest pain, chills, fever, headaches, myalgias, rash, shortness of breath, weight loss or wheezing.   No results found for: NA, K, CO2, GLUCOSE, BUN, CREATININE, CALCIUM, EGFR, GFRNONAA, GLUCOSE Lab Results  Component Value Date   CHOL 246 (A) 09/25/2013   HDL 61 09/25/2013   LDLCALC 146 09/25/2013   TRIG 195 (A) 09/25/2013   Lab Results  Component Value Date   TSH 0.80 09/25/2013   No results found for: HGBA1C Lab Results  Component Value Date   HGB 14.5 09/25/2013   No results found for: ALT, AST, GGT, ALKPHOS, BILITOT No results found for: 25OHVITD2, 25OHVITD3, VD25OH   Review of Systems  Constitutional:  Negative for appetite change, chills, diaphoresis, fatigue, fever, unexpected weight change and weight loss.  HENT:  Negative for hearing loss, tinnitus, trouble swallowing and voice change.   Eyes:  Negative for visual disturbance.  Respiratory:  Positive for cough. Negative for choking, shortness of breath and wheezing.   Cardiovascular:  Negative for chest pain, palpitations  and leg swelling.  Gastrointestinal:  Negative for abdominal pain, blood in stool, constipation and diarrhea.  Genitourinary:  Negative for difficulty urinating, dysuria and frequency.  Musculoskeletal:  Negative for arthralgias, back pain and myalgias.  Skin:  Negative for color change and rash.  Neurological:  Negative for dizziness, syncope and headaches.  Hematological:  Negative for adenopathy.  Psychiatric/Behavioral:  Negative for dysphoric mood and sleep disturbance. The patient is not nervous/anxious.    Patient Active Problem List   Diagnosis Date Noted   Primary osteoarthritis of left knee 07/25/2021   Primary osteoarthritis of right knee 07/25/2021   Chronic pain of left knee 07/11/2021   Arthralgia of right knee 07/11/2021   Special screening for malignant neoplasms, colon    Rectal polyp    Polyp of sigmoid colon    Erectile dysfunction 12/10/2017   BPH with obstruction/lower urinary tract symptoms 12/10/2017   AC (acromioclavicular) arthritis 02/17/2015   History of colon polyps 02/17/2015   Combined fat and carbohydrate induced hyperlipemia 02/17/2015   Plantar fasciitis 02/17/2015   Tendinitis of elbow or forearm 02/17/2015   Compulsive tobacco user syndrome 02/17/2015    No Known Allergies  Past Surgical History:  Procedure Laterality Date   COLONOSCOPY  2010   @ [facility]   COLONOSCOPY WITH PROPOFOL N/A 01/17/2018   Procedure: COLONOSCOPY WITH PROPOFOL WITH BIOPSIES;  Surgeon: Lucilla Lame, MD;  Location: [place];  Service: Endoscopy;  Laterality:  N/A;   POLYPECTOMY N/A 01/17/2018   Procedure: POLYPECTOMY;  Surgeon: Lucilla Lame, MD;  Location: [place];  Service: Endoscopy;  Laterality: N/A;    Social History   Tobacco Use   Smoking status: Every Day    Packs/day: 0.50    Years: 30.00    Pack years: 15.00    Types: Cigarettes    Start date: 04/23/1990   Smokeless tobacco: Never  Vaping Use   Vaping Use: Never used  Substance Use  Topics   Alcohol use: Yes    Alcohol/week: 21.0 standard drinks    Types: 21 Standard drinks or equivalent per week   Drug use: Not Currently     Medication list has been reviewed and updated.  Current Meds  Medication Sig   celecoxib (CELEBREX) 100 MG capsule Take 1 capsule (100 mg total) by mouth 2 (two) times daily as needed.   diclofenac Sodium (VOLTAREN) 1 % GEL Apply topically 4 (four) times daily.   Multiple Vitamins-Minerals (MULTIVITAMIN MEN 50+ PO) Take by mouth daily.       09/25/2021    3:17 PM 09/25/2021    3:16 PM 07/25/2021    9:35 AM 07/11/2021    9:30 AM  GAD 7 : Generalized Anxiety Score  Nervous, Anxious, on Edge 0 0 0 0  Control/stop worrying 0 0 0 0  Worry too much - different things 0 0 0 0  Trouble relaxing 0 0 0 0  Restless 0 0  0  Easily annoyed or irritable 1 1 0 0  Afraid - awful might happen 0 0 0 0  Total GAD 7 Score 1 1  0  Anxiety Difficulty Not difficult at all Not difficult at all Not difficult at all        09/25/2021    3:17 PM  Depression screen PHQ 2/9  Decreased Interest 0  Down, Depressed, Hopeless 0  PHQ - 2 Score 0  Altered sleeping 1  Tired, decreased energy 1  Change in appetite 0  Feeling bad or failure about yourself  0  Trouble concentrating 0  Moving slowly or fidgety/restless 0  Suicidal thoughts 0  PHQ-9 Score 2  Difficult doing work/chores Not difficult at all    BP Readings from Last 3 Encounters:  09/25/21 104/76  07/25/21 140/90  07/11/21 138/88    Physical Exam Vitals and nursing note reviewed.  Constitutional:      Appearance: Normal appearance. He is well-developed.  HENT:     Nose: Nose normal.  Eyes:     Conjunctiva/sclera: Conjunctivae normal.     Pupils: Pupils are equal, round, and reactive to light.  Neck:     Thyroid: No thyromegaly.     Vascular: No carotid bruit.  Cardiovascular:     Rate and Rhythm: Normal rate and regular rhythm. No extrasystoles are present.    Pulses:          Femoral  pulses are 2+ on the right side and 2+ on the left side.      Popliteal pulses are 1+ on the right side and 1+ on the left side.       Dorsalis pedis pulses are 1+ on the right side and 1+ on the left side.     Heart sounds: Normal heart sounds.  Pulmonary:     Effort: Pulmonary effort is normal.     Breath sounds: Normal breath sounds. No wheezing.  Chest:  Breasts:    Right: No mass.  Left: No mass.  Abdominal:     General: Bowel sounds are normal.     Palpations: Abdomen is soft.     Tenderness: There is no abdominal tenderness.  Musculoskeletal:     Cervical back: Normal range of motion and neck supple.     Right knee: No effusion. Decreased range of motion.     Left knee: No effusion. Decreased range of motion.     Right lower leg: No edema.     Left lower leg: No edema.  Lymphadenopathy:     Cervical: No cervical adenopathy.  Skin:    General: Skin is warm and dry.     Capillary Refill: Capillary refill takes less than 2 seconds.  Neurological:     General: No focal deficit present.     Mental Status: He is alert and oriented to person, place, and time.     Gait: Gait normal.     Deep Tendon Reflexes: Reflexes are normal and symmetric.  Psychiatric:        Attention and Perception: Attention normal.        Mood and Affect: Mood normal.    Wt Readings from Last 3 Encounters:  09/25/21 161 lb 6.4 oz (73.2 kg)  07/25/21 163 lb (73.9 kg)  07/11/21 168 lb 9.6 oz (76.5 kg)    BP 104/76   Pulse 72   Ht 5' 8"  (1.727 m)   Wt 161 lb 6.4 oz (73.2 kg)   SpO2 96%   BMI 24.54 kg/m   Assessment and Plan: 1. Annual physical exam Normal exam. Due for Prevnar-20 Colonoscopy due next year. - Comprehensive metabolic panel  2. Tobacco use disorder, continuous Discussed options to help quit and decided on Chantix. Will also refer for lung cancer screening CT - Ambulatory Referral for Lung Cancer Scre - varenicline (CHANTIX PAK) 0.5 MG X 11 & 1 MG X 42 tablet; Take one  0.5 mg tablet by mouth once daily for 3 days, then increase to one 0.5 mg tablet twice daily for 4 days, then increase to one 1 mg tablet twice daily.  Dispense: 53 tablet; Refill: 0  3. Chronic cough Likely due to smoking - CT will help evaluate for more worrisome causes - CBC with Differential/Platelet  4. Primary osteoarthritis of left knee Seen by Sports Med and placed on Mobic  5. Mixed hyperlipidemia Lipids have been borderline elevated in the past. Recommend low fat diet. - Lipid panel  6. BPH with obstruction/lower urinary tract symptoms Minimal sx other than urgency when bladder is full. DRE deferred. - PSA  7. Need for hepatitis C screening test - Hepatitis C antibody  8. Need for vaccination for pneumococcus - Pneumococcal conjugate vaccine 20-valent   Partially dictated using Editor, commissioning. Any errors are unintentional.  Halina Maidens, MD [facility]  09/25/2021

## 2021-09-25 NOTE — Patient Instructions (Signed)
Take the started pack of Chantix.  If helpful, message me for the continuation pack which you can take for up to 5 months.  Someone from [facility] will call you for the CT scan of your chest.

## 2021-09-26 ENCOUNTER — Other Ambulatory Visit: Payer: Self-pay

## 2021-09-26 ENCOUNTER — Encounter: Payer: Self-pay | Admitting: Internal Medicine

## 2021-09-26 LAB — COMPREHENSIVE METABOLIC PANEL
ALT: 19 IU/L (ref 0–44)
AST: 17 IU/L (ref 0–40)
Albumin/Globulin Ratio: 2.1 (ref 1.2–2.2)
Albumin: 4.6 g/dL (ref 3.8–4.9)
Alkaline Phosphatase: 73 IU/L (ref 44–121)
BUN/Creatinine Ratio: 16 (ref 10–24)
BUN: 17 mg/dL (ref 8–27)
Bilirubin Total: 0.5 mg/dL (ref 0.0–1.2)
CO2: 23 mmol/L (ref 20–29)
Calcium: 9.9 mg/dL (ref 8.6–10.2)
Chloride: 102 mmol/L (ref 96–106)
Creatinine, Ser: 1.09 mg/dL (ref 0.76–1.27)
Globulin, Total: 2.2 g/dL (ref 1.5–4.5)
Glucose: 99 mg/dL (ref 70–99)
Potassium: 4.2 mmol/L (ref 3.5–5.2)
Sodium: 140 mmol/L (ref 134–144)
Total Protein: 6.8 g/dL (ref 6.0–8.5)
eGFR: 78 mL/min/{1.73_m2} (ref >59)

## 2021-09-26 LAB — CBC WITH DIFFERENTIAL/PLATELET
Basophils Absolute: 0.1 10*3/uL (ref 0.0–0.2)
Basos: 1 %
EOS (ABSOLUTE): 0.2 10*3/uL (ref 0.0–0.4)
Eos: 2 %
Hematocrit: 40.7 % (ref 37.5–51.0)
Hemoglobin: 14.1 g/dL (ref 13.0–17.7)
Immature Grans (Abs): 0.1 10*3/uL (ref 0.0–0.1)
Immature Granulocytes: 1 %
Lymphocytes Absolute: 2.7 10*3/uL (ref 0.7–3.1)
Lymphs: 24 %
MCH: 31.7 pg (ref 26.6–33.0)
MCHC: 34.6 g/dL (ref 31.5–35.7)
MCV: 92 fL (ref 79–97)
Monocytes Absolute: 0.9 10*3/uL (ref 0.1–0.9)
Monocytes: 8 %
Neutrophils Absolute: 7.1 10*3/uL — ABNORMAL HIGH (ref 1.4–7.0)
Neutrophils: 64 %
Platelets: 284 10*3/uL (ref 150–450)
RBC: 4.45 x10E6/uL (ref 4.14–5.80)
RDW: 12.8 % (ref 11.6–15.4)
WBC: 11 10*3/uL — ABNORMAL HIGH (ref 3.4–10.8)

## 2021-09-26 LAB — LIPID PANEL
Chol/HDL Ratio: 6.3 ratio — ABNORMAL HIGH (ref 0.0–5.0)
Cholesterol, Total: 302 mg/dL — ABNORMAL HIGH (ref 100–199)
HDL: 48 mg/dL (ref >39)
LDL Chol Calc (NIH): 153 mg/dL — ABNORMAL HIGH (ref 0–99)
Triglycerides: 527 mg/dL — ABNORMAL HIGH (ref 0–149)
VLDL Cholesterol Cal: 101 mg/dL — ABNORMAL HIGH (ref 5–40)

## 2021-09-26 LAB — PSA: Prostate Specific Ag, Serum: 1.7 ng/mL (ref 0.0–4.0)

## 2021-09-26 LAB — HEPATITIS C ANTIBODY: Hep C Virus Ab: NONREACTIVE

## 2021-09-26 MED ORDER — ATORVASTATIN CALCIUM 10 MG PO TABS: 10.0000 mg | ORAL_TABLET | Freq: Every day | ORAL | 3 refills | Status: AC

## 2021-09-28 ENCOUNTER — Encounter: Payer: BC Managed Care – PPO | Admitting: Physical Therapy

## 2021-10-05 ENCOUNTER — Encounter: Payer: BC Managed Care – PPO | Admitting: Physical Therapy

## 2022-01-03 ENCOUNTER — Ambulatory Visit: Payer: BC Managed Care – PPO | Admitting: Internal Medicine

## 2022-01-08 ENCOUNTER — Encounter: Payer: Self-pay | Admitting: Family Medicine

## 2022-03-16 ENCOUNTER — Other Ambulatory Visit: Payer: Self-pay | Admitting: Family Medicine

## 2022-03-16 DIAGNOSIS — M1711 Unilateral primary osteoarthritis, right knee: Secondary | ICD-10-CM

## 2022-03-16 DIAGNOSIS — M1712 Unilateral primary osteoarthritis, left knee: Secondary | ICD-10-CM

## 2022-03-16 NOTE — Telephone Encounter (Signed)
Requested medications are due for refill today.  yes  Requested medications are on the active medications list.  yes  Last refill. 07/25/2021 #60 1 rf  Future visit scheduled.   no  Notes to clinic.  Pt now sees [employer].    Requested Prescriptions  Pending Prescriptions Disp Refills   celecoxib (CELEBREX) 100 MG capsule [Pharmacy Med Name: CELECOXIB 100MG CAPSULES] 60 capsule 1    Sig: TAKE 1 CAPSULE(100 MG) BY MOUTH TWICE DAILY AS NEEDED     Analgesics:  COX2 Inhibitors Failed - 03/16/2022  3:46 AM      Failed - Manual Review: Labs are only required if the patient has taken medication for more than 8 weeks.      Passed - HGB in normal range and within 360 days    Hemoglobin  Date Value Ref Range Status  09/25/2021 14.1 13.0 - 17.7 g/dL Final         Passed - Cr in normal range and within 360 days    Creatinine, Ser  Date Value Ref Range Status  09/25/2021 1.09 0.76 - 1.27 mg/dL Final         Passed - HCT in normal range and within 360 days    Hematocrit  Date Value Ref Range Status  09/25/2021 40.7 37.5 - 51.0 % Final         Passed - AST in normal range and within 360 days    AST  Date Value Ref Range Status  09/25/2021 17 0 - 40 IU/L Final         Passed - ALT in normal range and within 360 days    ALT  Date Value Ref Range Status  09/25/2021 19 0 - 44 IU/L Final         Passed - eGFR is 30 or above and within 360 days    eGFR  Date Value Ref Range Status  09/25/2021 78 >59 mL/min/1.73 Final         Passed - Patient is not pregnant      Passed - Valid encounter within last 12 months    Recent Outpatient Visits           5 months ago Annual physical exam   [place] [facility], Jesse Sans, MD   7 months ago Primary osteoarthritis of left knee   [place] [facility] [place], Earley Abide, MD   8 months ago Chronic pain of left knee   [facility] at [place], Earley Abide, MD   4 years ago Annual physical exam   [facility] at [facility], Jesse Sans, MD   5 years ago Impacted cerumen of both ears   [place] [facility] at [facility], Jesse Sans, MD

## 2022-03-19 NOTE — Telephone Encounter (Signed)
Left voice mail to set up refill appointment.

## 2022-10-19 ENCOUNTER — Other Ambulatory Visit: Payer: Self-pay | Admitting: Internal Medicine

## 2022-10-19 ENCOUNTER — Other Ambulatory Visit: Payer: Self-pay | Admitting: Family Medicine

## 2022-10-19 DIAGNOSIS — G8929 Other chronic pain: Secondary | ICD-10-CM

## 2022-10-19 DIAGNOSIS — M25462 Effusion, left knee: Secondary | ICD-10-CM

## 2022-10-22 ENCOUNTER — Other Ambulatory Visit: Payer: Self-pay | Admitting: Family Medicine

## 2022-10-22 DIAGNOSIS — G8929 Other chronic pain: Secondary | ICD-10-CM

## 2022-10-22 DIAGNOSIS — M25462 Effusion, left knee: Secondary | ICD-10-CM

## 2022-10-22 NOTE — Telephone Encounter (Signed)
Requested medications are due for refill today.  no  Requested medications are on the active medications list.  no  Last refill. 07/11/2021  Future visit scheduled.   no  Notes to clinic.  Laqueta Due listed as pcp.     Requested Prescriptions  Pending Prescriptions Disp Refills   meloxicam (MOBIC) 15 MG tablet [Pharmacy Med Name: MELOXICAM 15MG  TABLETS] 30 tablet 0    Sig: TAKE 1 TABLET(15 MG) BY MOUTH DAILY     Analgesics:  COX2 Inhibitors Failed - 10/19/2022  9:33 PM      Failed - Manual Review: Labs are only required if the patient has taken medication for more than 8 weeks.      Failed - HGB in normal range and within 360 days    Hemoglobin  Date Value Ref Range Status  09/25/2021 14.1 13.0 - 17.7 g/dL Final         Failed - Cr in normal range and within 360 days    Creatinine, Ser  Date Value Ref Range Status  09/25/2021 1.09 0.76 - 1.27 mg/dL Final         Failed - HCT in normal range and within 360 days    Hematocrit  Date Value Ref Range Status  09/25/2021 40.7 37.5 - 51.0 % Final         Failed - AST in normal range and within 360 days    AST  Date Value Ref Range Status  09/25/2021 17 0 - 40 IU/L Final         Failed - ALT in normal range and within 360 days    ALT  Date Value Ref Range Status  09/25/2021 19 0 - 44 IU/L Final         Failed - eGFR is 30 or above and within 360 days    eGFR  Date Value Ref Range Status  09/25/2021 78 >59 mL/min/1.73 Final         Failed - Valid encounter within last 12 months    Recent Outpatient Visits           1 year ago Annual physical exam   [place] [facility], Nyoka Cowden, MD   1 year ago Primary osteoarthritis of left knee   [place] [facility], Ocie Bob, MD   1 year ago Chronic pain of left knee   [facility] at [facility], Ocie Bob, MD   4 years ago  Annual physical exam   [facility] at [facility], Nyoka Cowden, MD   5 years ago Impacted cerumen of both ears   [facility] at [facility], Nyoka Cowden, MD              Passed - Patient is not pregnant

## 2023-09-21 IMAGING — CR DG KNEE COMPLETE 4+V*R*
4 series · 4 of 4 positions shown · non-contrast
Comparison: None

CLINICAL DATA: Medial right knee pain

EXAM:
RIGHT KNEE - COMPLETE 4+ VIEW

[knee ap]
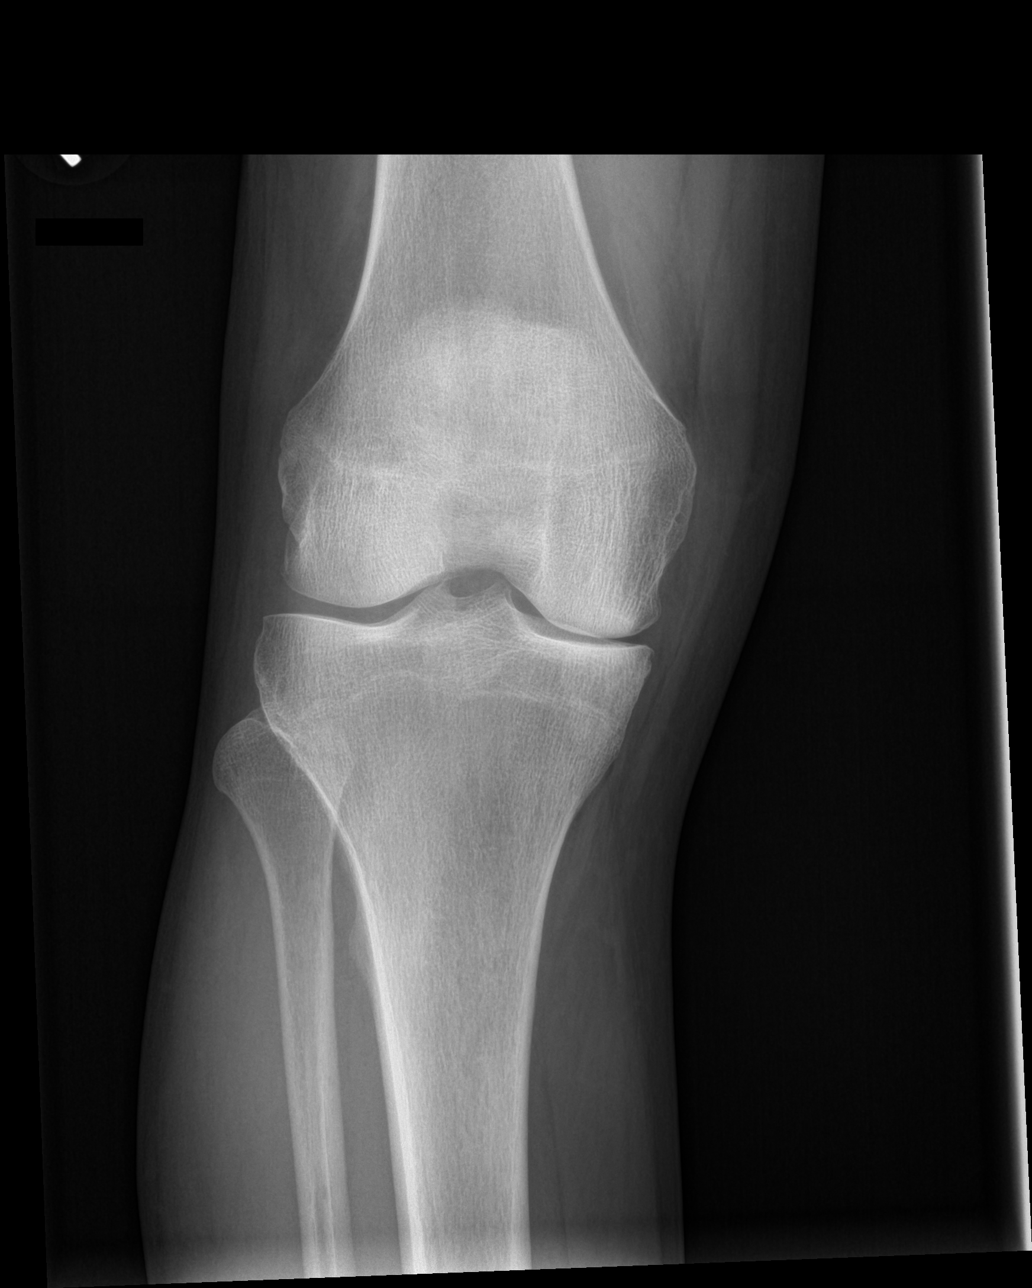

[knee lat]
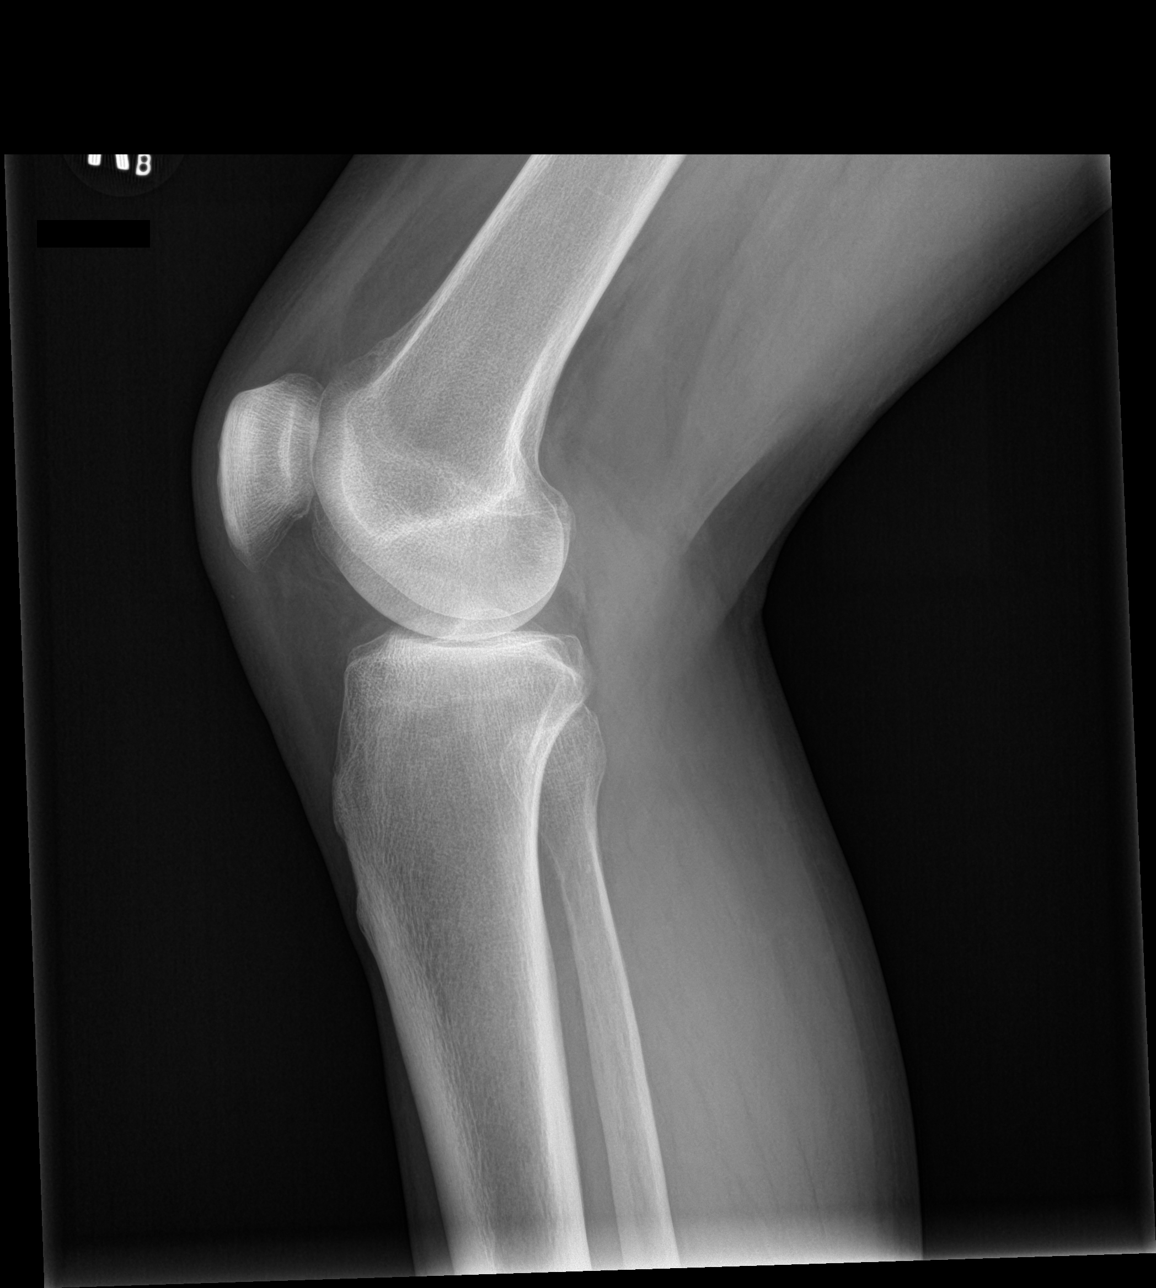

[tunnel]
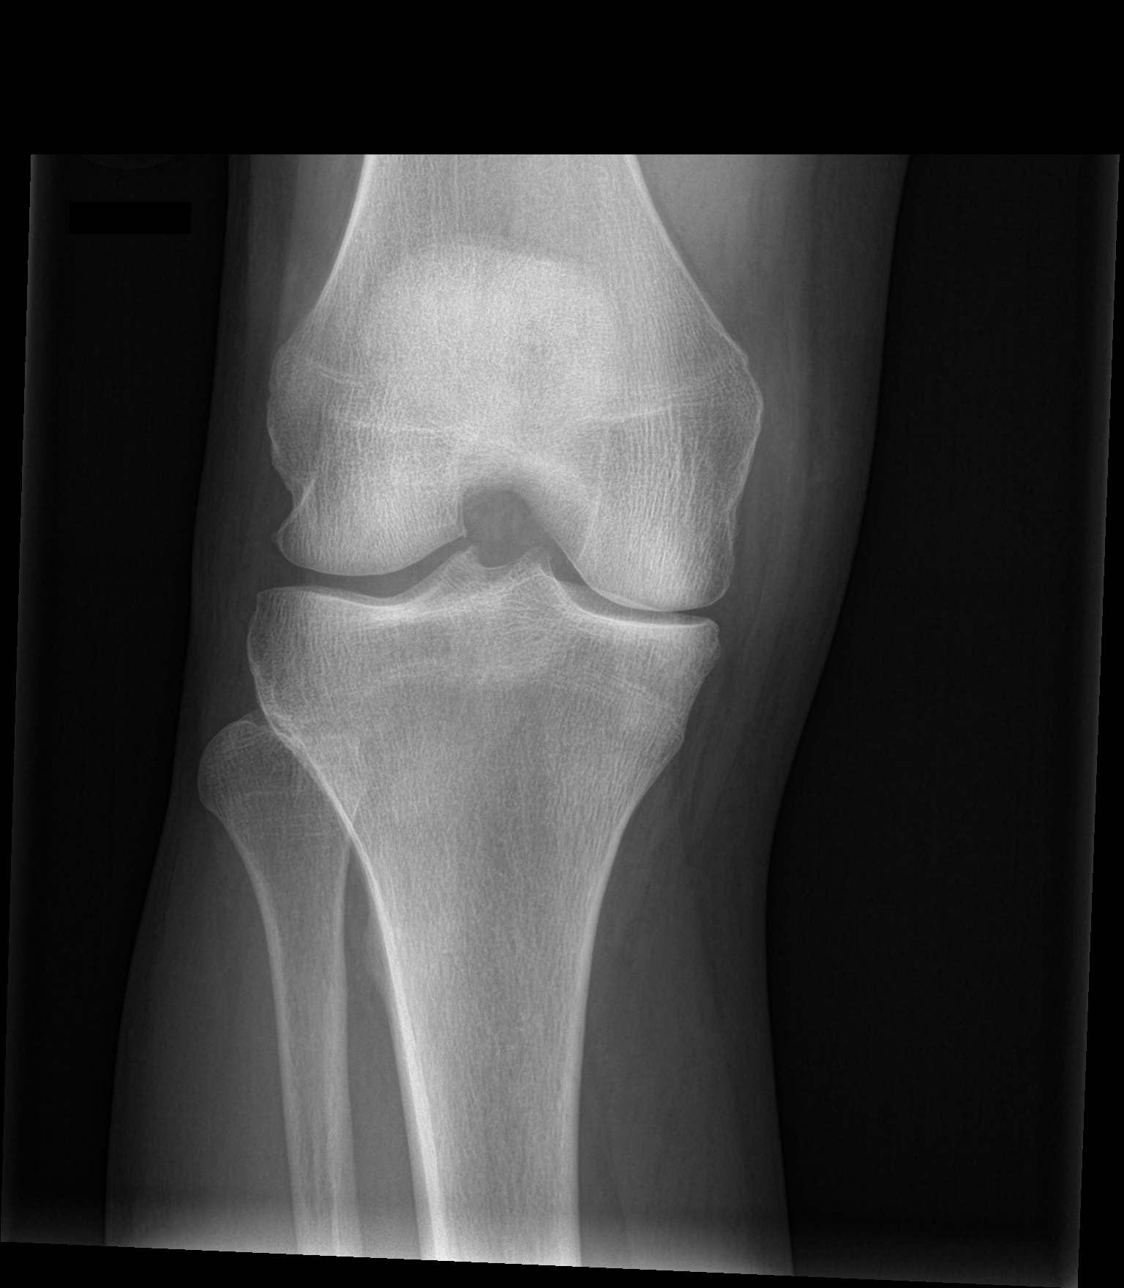

[patella skyline]
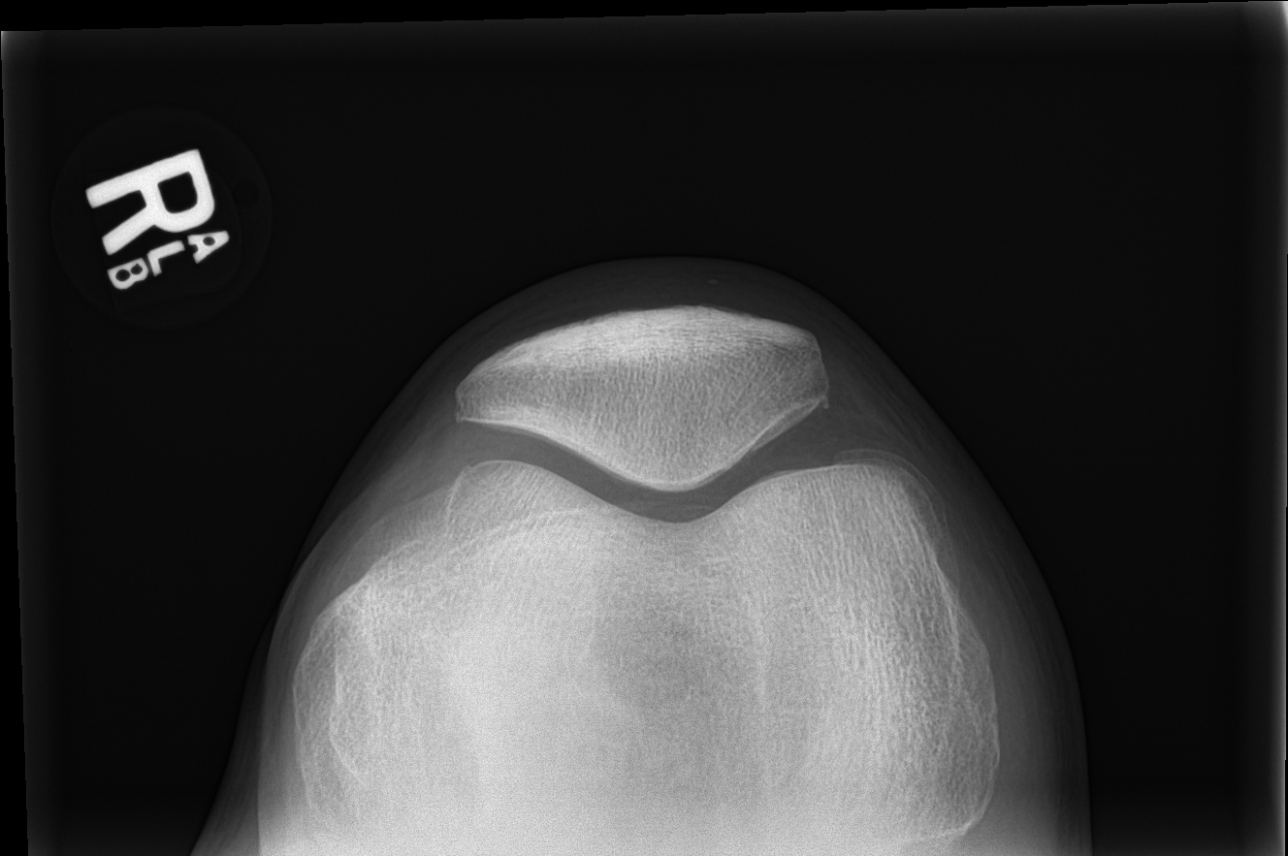

[4 of 4 positions shown; findings below may reference images not displayed]

FINDINGS: Frontal, oblique, lateral, and sunrise views of the right knee are
obtained. No fracture, subluxation, or dislocation. Moderate medial
compartmental joint space narrowing. No joint effusion. Soft tissues
are unremarkable.
IMPRESSION: 1. Moderate medial compartmental osteoarthritis. No acute bony
abnormality.
